# Patient Record
Sex: Female | Born: 1965 | Race: White | Hispanic: No | Smoking: Never smoker
Health system: Southern US, Community
[De-identification: ages and names within clinical notes are randomized; demographics above are authoritative.]

## PROBLEM LIST (undated history)

## (undated) DIAGNOSIS — I10 Essential (primary) hypertension: Secondary | ICD-10-CM

## (undated) DIAGNOSIS — O1490 Unspecified pre-eclampsia, unspecified trimester: Secondary | ICD-10-CM

## (undated) DIAGNOSIS — M1611 Unilateral primary osteoarthritis, right hip: Secondary | ICD-10-CM

## (undated) DIAGNOSIS — C801 Malignant (primary) neoplasm, unspecified: Secondary | ICD-10-CM

## (undated) HISTORY — DX: Unspecified pre-eclampsia, unspecified trimester: O14.90

## (undated) HISTORY — DX: Malignant (primary) neoplasm, unspecified: C80.1

## (undated) HISTORY — PX: FACIAL COSMETIC SURGERY: SHX629

## (undated) HISTORY — DX: Essential (primary) hypertension: I10

---

## 1998-05-02 ENCOUNTER — Other Ambulatory Visit: Admission: RE | Admit: 1998-05-02 | Discharge: 1998-05-02 | Payer: Self-pay | Admitting: Gynecology

## 1998-08-07 ENCOUNTER — Encounter: Admission: RE | Admit: 1998-08-07 | Discharge: 1998-08-07 | Payer: Self-pay | Admitting: Sports Medicine

## 1998-11-21 ENCOUNTER — Encounter (INDEPENDENT_AMBULATORY_CARE_PROVIDER_SITE_OTHER): Payer: Self-pay

## 1998-11-21 ENCOUNTER — Inpatient Hospital Stay (HOSPITAL_COMMUNITY): Admission: AD | Admit: 1998-11-21 | Discharge: 1998-11-23 | Payer: Self-pay | Admitting: Gynecology

## 1999-01-07 ENCOUNTER — Other Ambulatory Visit: Admission: RE | Admit: 1999-01-07 | Discharge: 1999-01-07 | Payer: Self-pay | Admitting: Gynecology

## 1999-01-22 ENCOUNTER — Encounter: Admission: RE | Admit: 1999-01-22 | Discharge: 1999-01-22 | Payer: Self-pay | Admitting: Family Medicine

## 1999-01-22 ENCOUNTER — Encounter: Payer: Self-pay | Admitting: Family Medicine

## 1999-01-22 ENCOUNTER — Encounter: Admission: RE | Admit: 1999-01-22 | Discharge: 1999-01-22 | Payer: Self-pay | Admitting: *Deleted

## 1999-01-30 ENCOUNTER — Encounter: Admission: RE | Admit: 1999-01-30 | Discharge: 1999-01-30 | Payer: Self-pay | Admitting: Sports Medicine

## 1999-03-20 ENCOUNTER — Encounter: Admission: RE | Admit: 1999-03-20 | Discharge: 1999-03-20 | Payer: Self-pay | Admitting: Sports Medicine

## 1999-07-14 ENCOUNTER — Encounter: Admission: RE | Admit: 1999-07-14 | Discharge: 1999-07-14 | Payer: Self-pay | Admitting: Family Medicine

## 1999-08-22 ENCOUNTER — Ambulatory Visit: Admission: RE | Admit: 1999-08-22 | Discharge: 1999-08-22 | Payer: Self-pay | Admitting: Family Medicine

## 1999-08-22 ENCOUNTER — Encounter: Admission: RE | Admit: 1999-08-22 | Discharge: 1999-08-22 | Payer: Self-pay | Admitting: Family Medicine

## 1999-09-11 ENCOUNTER — Encounter: Admission: RE | Admit: 1999-09-11 | Discharge: 1999-09-11 | Payer: Self-pay | Admitting: Sports Medicine

## 1999-11-03 ENCOUNTER — Encounter: Admission: RE | Admit: 1999-11-03 | Discharge: 1999-11-03 | Payer: Self-pay | Admitting: Family Medicine

## 1999-11-19 ENCOUNTER — Encounter: Admission: RE | Admit: 1999-11-19 | Discharge: 1999-11-19 | Payer: Self-pay | Admitting: Family Medicine

## 2000-03-02 ENCOUNTER — Other Ambulatory Visit: Admission: RE | Admit: 2000-03-02 | Discharge: 2000-03-02 | Payer: Self-pay | Admitting: Gynecology

## 2000-03-31 ENCOUNTER — Encounter: Admission: RE | Admit: 2000-03-31 | Discharge: 2000-03-31 | Payer: Self-pay | Admitting: Family Medicine

## 2000-04-08 ENCOUNTER — Encounter: Admission: RE | Admit: 2000-04-08 | Discharge: 2000-04-08 | Payer: Self-pay | Admitting: Sports Medicine

## 2000-07-19 ENCOUNTER — Encounter: Admission: RE | Admit: 2000-07-19 | Discharge: 2000-07-19 | Payer: Self-pay | Admitting: Family Medicine

## 2001-03-11 ENCOUNTER — Other Ambulatory Visit: Admission: RE | Admit: 2001-03-11 | Discharge: 2001-03-11 | Payer: Self-pay | Admitting: Gynecology

## 2001-09-29 ENCOUNTER — Encounter: Admission: RE | Admit: 2001-09-29 | Discharge: 2001-09-29 | Payer: Self-pay | Admitting: Sports Medicine

## 2002-02-24 ENCOUNTER — Encounter: Admission: RE | Admit: 2002-02-24 | Discharge: 2002-02-24 | Payer: Self-pay | Admitting: Family Medicine

## 2002-03-17 ENCOUNTER — Other Ambulatory Visit: Admission: RE | Admit: 2002-03-17 | Discharge: 2002-03-17 | Payer: Self-pay | Admitting: Gynecology

## 2002-05-12 ENCOUNTER — Encounter: Admission: RE | Admit: 2002-05-12 | Discharge: 2002-05-12 | Payer: Self-pay | Admitting: Family Medicine

## 2002-06-06 ENCOUNTER — Encounter: Admission: RE | Admit: 2002-06-06 | Discharge: 2002-06-06 | Payer: Self-pay | Admitting: Family Medicine

## 2002-06-08 ENCOUNTER — Encounter: Admission: RE | Admit: 2002-06-08 | Discharge: 2002-06-08 | Payer: Self-pay | Admitting: Family Medicine

## 2004-02-23 ENCOUNTER — Emergency Department (HOSPITAL_COMMUNITY): Admission: EM | Admit: 2004-02-23 | Discharge: 2004-02-23 | Payer: Self-pay | Admitting: Family Medicine

## 2005-05-19 ENCOUNTER — Emergency Department (HOSPITAL_COMMUNITY): Admission: EM | Admit: 2005-05-19 | Discharge: 2005-05-19 | Payer: Self-pay | Admitting: Family Medicine

## 2006-04-08 ENCOUNTER — Ambulatory Visit: Payer: Self-pay | Admitting: Sports Medicine

## 2006-06-02 ENCOUNTER — Telehealth: Payer: Self-pay | Admitting: *Deleted

## 2006-06-02 ENCOUNTER — Ambulatory Visit: Payer: Self-pay | Admitting: Sports Medicine

## 2006-06-02 ENCOUNTER — Ambulatory Visit (HOSPITAL_COMMUNITY): Admission: RE | Admit: 2006-06-02 | Discharge: 2006-06-02 | Payer: Self-pay | Admitting: Family Medicine

## 2006-06-02 DIAGNOSIS — R072 Precordial pain: Secondary | ICD-10-CM | POA: Insufficient documentation

## 2007-04-07 ENCOUNTER — Ambulatory Visit: Payer: Self-pay | Admitting: Sports Medicine

## 2007-04-07 DIAGNOSIS — L258 Unspecified contact dermatitis due to other agents: Secondary | ICD-10-CM | POA: Insufficient documentation

## 2007-04-07 DIAGNOSIS — G43909 Migraine, unspecified, not intractable, without status migrainosus: Secondary | ICD-10-CM | POA: Insufficient documentation

## 2007-04-07 DIAGNOSIS — R030 Elevated blood-pressure reading, without diagnosis of hypertension: Secondary | ICD-10-CM | POA: Insufficient documentation

## 2007-04-07 DIAGNOSIS — R7309 Other abnormal glucose: Secondary | ICD-10-CM | POA: Insufficient documentation

## 2007-04-21 ENCOUNTER — Ambulatory Visit: Payer: Self-pay | Admitting: Sports Medicine

## 2007-04-21 DIAGNOSIS — D485 Neoplasm of uncertain behavior of skin: Secondary | ICD-10-CM | POA: Insufficient documentation

## 2007-06-09 ENCOUNTER — Ambulatory Visit: Payer: Self-pay | Admitting: Sports Medicine

## 2007-06-09 DIAGNOSIS — C4490 Unspecified malignant neoplasm of skin, unspecified: Secondary | ICD-10-CM | POA: Insufficient documentation

## 2007-11-08 ENCOUNTER — Ambulatory Visit: Payer: Self-pay | Admitting: Sports Medicine

## 2007-11-08 DIAGNOSIS — M79609 Pain in unspecified limb: Secondary | ICD-10-CM | POA: Insufficient documentation

## 2007-11-08 LAB — CONVERTED CEMR LAB
RBC: 4.01 M/uL (ref 3.87–5.11)
Rhuematoid fact SerPl-aCnc: <20 intl units/mL (ref 0–20)
WBC: 6.1 10*3/uL (ref 4.0–10.5)

## 2007-11-10 ENCOUNTER — Telehealth: Payer: Self-pay | Admitting: Sports Medicine

## 2008-04-04 ENCOUNTER — Ambulatory Visit: Payer: Self-pay | Admitting: Sports Medicine

## 2008-04-04 ENCOUNTER — Encounter (INDEPENDENT_AMBULATORY_CARE_PROVIDER_SITE_OTHER): Payer: Self-pay | Admitting: Family Medicine

## 2008-04-04 DIAGNOSIS — M25569 Pain in unspecified knee: Secondary | ICD-10-CM | POA: Insufficient documentation

## 2008-04-04 DIAGNOSIS — M25519 Pain in unspecified shoulder: Secondary | ICD-10-CM | POA: Insufficient documentation

## 2008-04-09 ENCOUNTER — Encounter (INDEPENDENT_AMBULATORY_CARE_PROVIDER_SITE_OTHER): Payer: Self-pay | Admitting: *Deleted

## 2008-04-10 ENCOUNTER — Encounter: Admission: RE | Admit: 2008-04-10 | Discharge: 2008-04-10 | Payer: Self-pay | Admitting: Sports Medicine

## 2008-04-18 ENCOUNTER — Encounter (INDEPENDENT_AMBULATORY_CARE_PROVIDER_SITE_OTHER): Payer: Self-pay | Admitting: *Deleted

## 2008-04-18 ENCOUNTER — Ambulatory Visit: Payer: Self-pay | Admitting: Family Medicine

## 2008-04-19 ENCOUNTER — Encounter: Payer: Self-pay | Admitting: Sports Medicine

## 2008-04-20 ENCOUNTER — Encounter: Payer: Self-pay | Admitting: Sports Medicine

## 2008-04-26 ENCOUNTER — Encounter: Admission: RE | Admit: 2008-04-26 | Discharge: 2008-04-26 | Payer: Self-pay | Admitting: Family Medicine

## 2008-10-31 ENCOUNTER — Ambulatory Visit: Payer: Self-pay | Admitting: Sports Medicine

## 2008-10-31 DIAGNOSIS — T887XXA Unspecified adverse effect of drug or medicament, initial encounter: Secondary | ICD-10-CM | POA: Insufficient documentation

## 2009-01-28 ENCOUNTER — Encounter: Admission: RE | Admit: 2009-01-28 | Discharge: 2009-01-28 | Payer: Self-pay | Admitting: Sports Medicine

## 2009-01-28 ENCOUNTER — Ambulatory Visit: Payer: Self-pay | Admitting: Sports Medicine

## 2009-01-28 DIAGNOSIS — M542 Cervicalgia: Secondary | ICD-10-CM | POA: Insufficient documentation

## 2009-07-04 ENCOUNTER — Other Ambulatory Visit: Admission: RE | Admit: 2009-07-04 | Discharge: 2009-07-04 | Payer: Self-pay | Admitting: Gynecology

## 2009-07-04 ENCOUNTER — Ambulatory Visit: Payer: Self-pay | Admitting: Gynecology

## 2010-01-23 IMAGING — CR DG CERVICAL SPINE COMPLETE 4+V
5 series · 5 of 5 positions shown · non-contrast
Comparison: Cervical spine series 02/23/2004.

CLINICAL DATA: Right sided neck pain.  No recent injuries.

CERVICAL SPINE - COMPLETE 4+ VIEW 01/28/2009:

[view not recorded (1 of 5)]
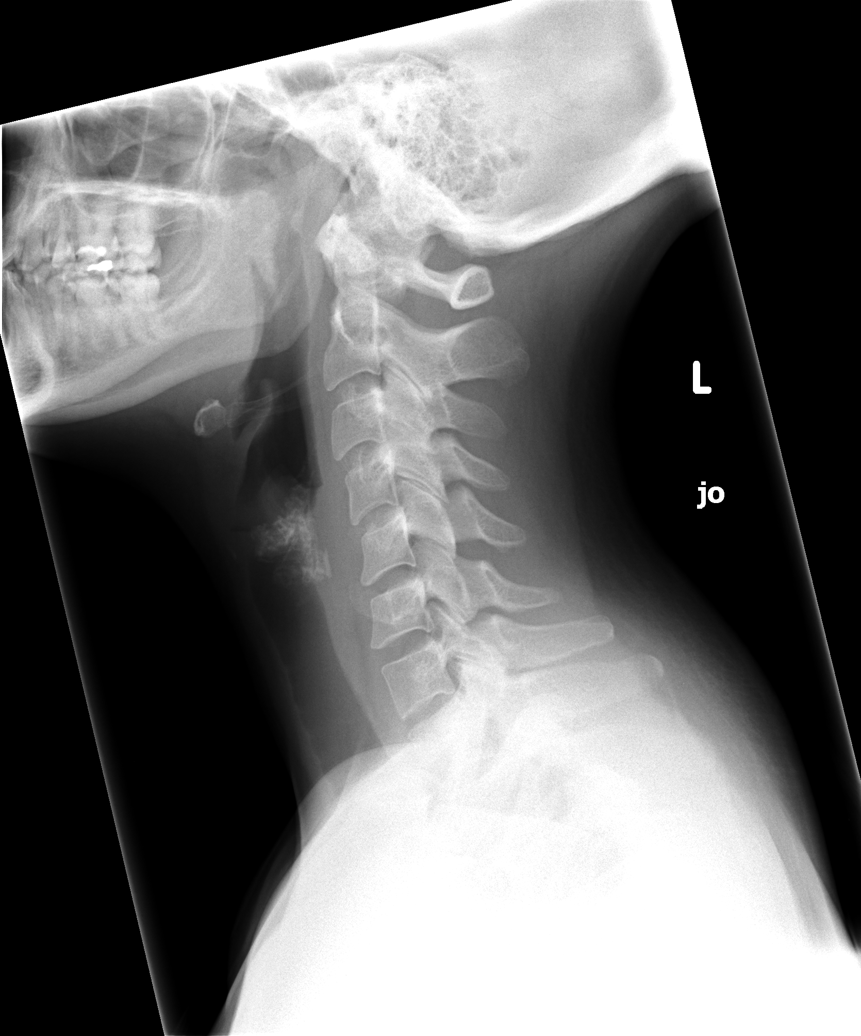

[view not recorded (2 of 5)]
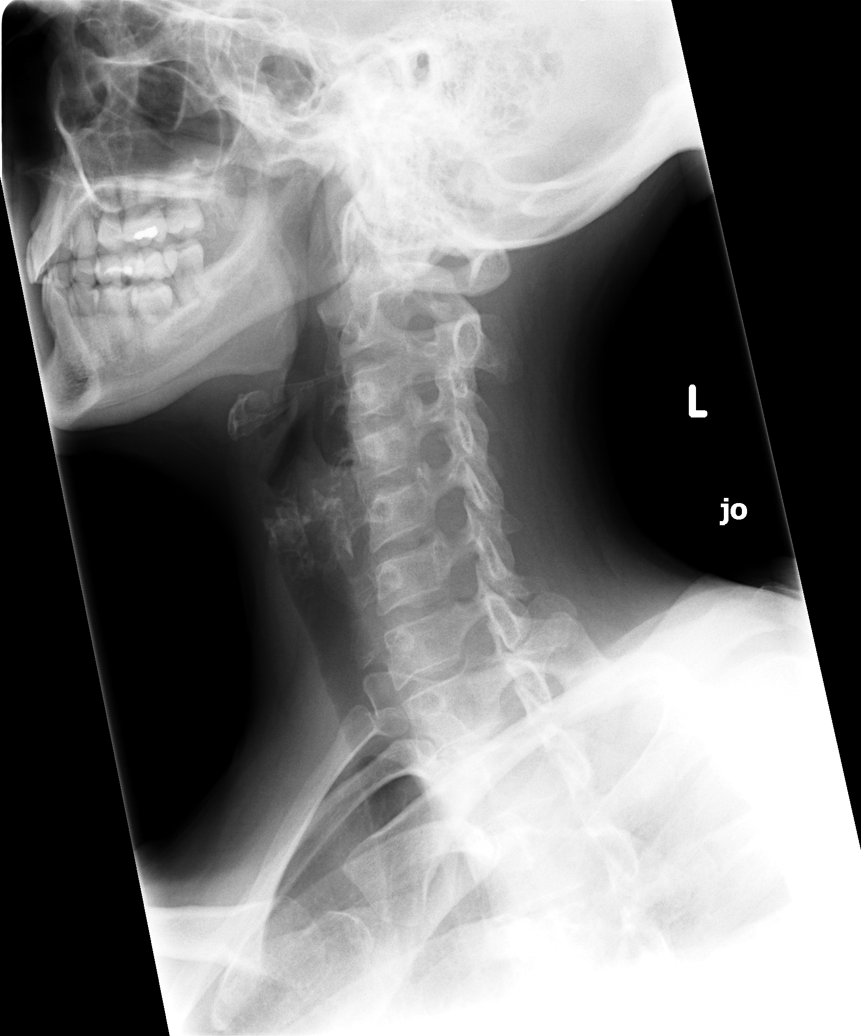

[view not recorded (3 of 5)]
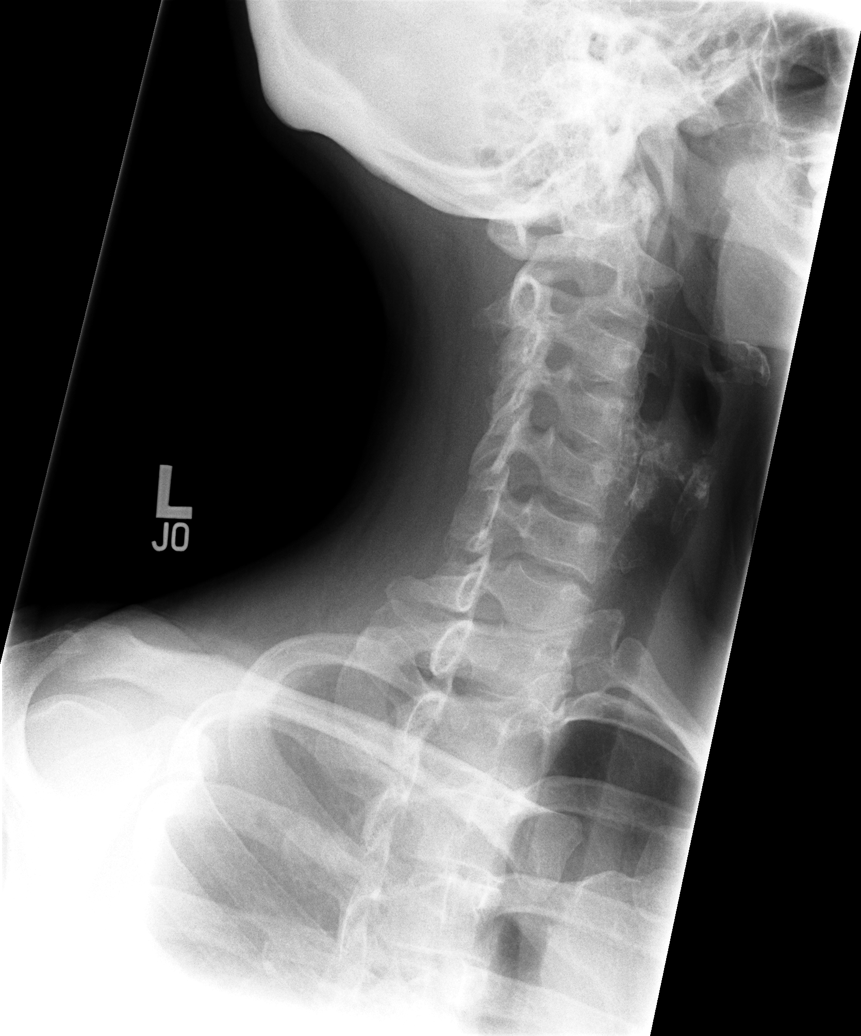

[view not recorded (4 of 5)]
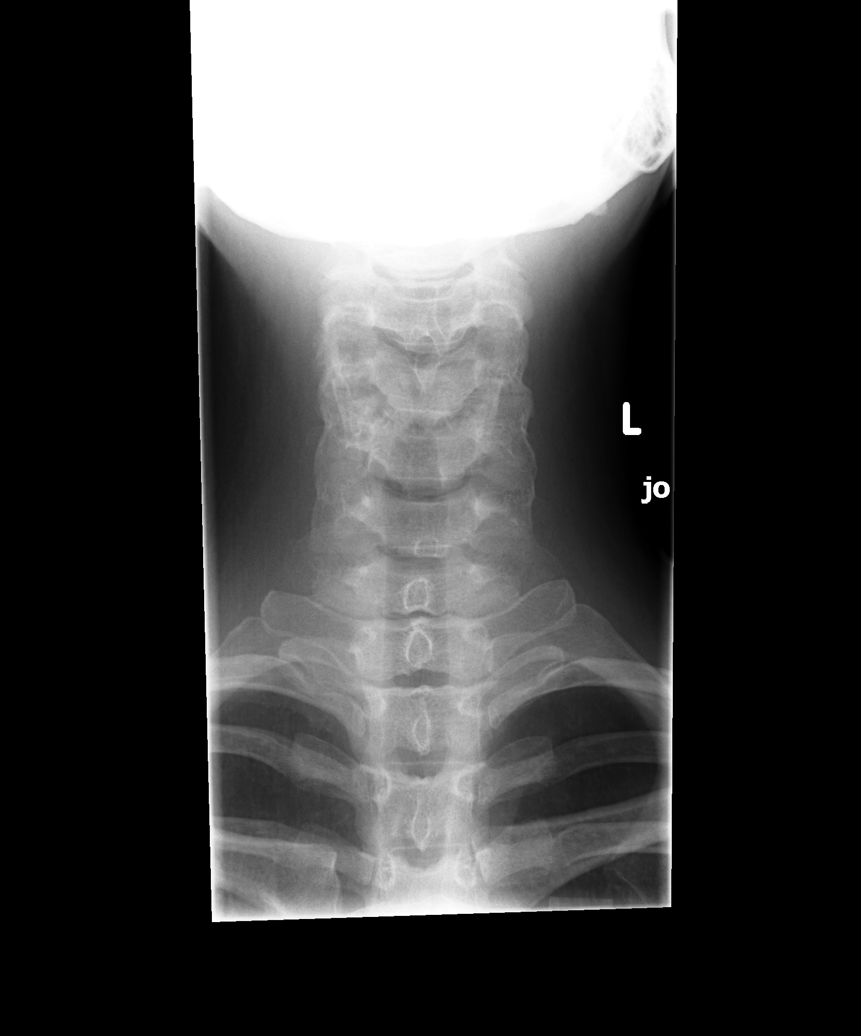

[view not recorded (5 of 5)]
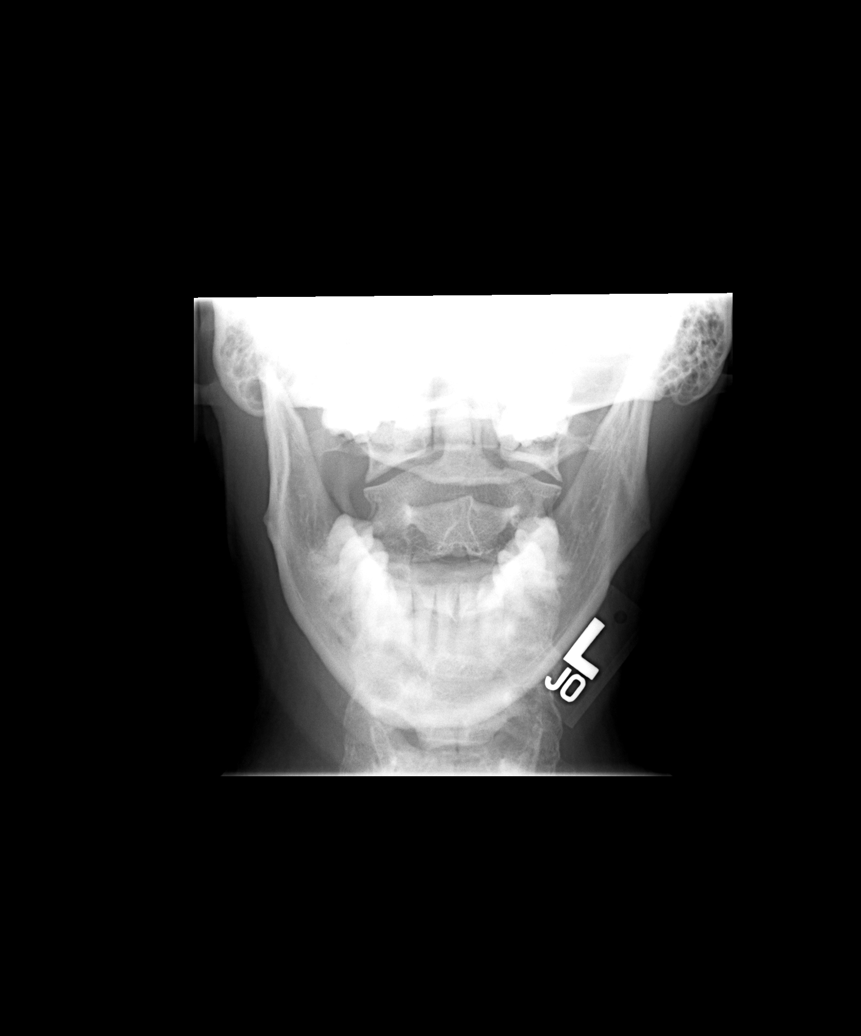

[5 of 5 positions shown; findings below may reference images not displayed]

FINDINGS: Anatomic posterior alignment.  No fractures.  Well-
preserved disc spaces, unchanged.  Normal prevertebral soft
tissues.  Facet joints intact.  No significant bony foraminal
stenoses.  No static evidence of instability.  No significant
interval change.
IMPRESSION: Normal examination, stable since February 2004.

## 2010-02-02 ENCOUNTER — Emergency Department (HOSPITAL_COMMUNITY)
Admission: EM | Admit: 2010-02-02 | Discharge: 2010-02-02 | Payer: Self-pay | Source: Home / Self Care | Admitting: Emergency Medicine

## 2010-03-09 LAB — CONVERTED CEMR LAB
Glucose, Fasting: 90 mg/dL (ref 70–99)
LDL Cholesterol: 90 mg/dL (ref 0–99)

## 2010-04-22 ENCOUNTER — Encounter: Payer: Self-pay | Admitting: *Deleted

## 2010-05-23 ENCOUNTER — Other Ambulatory Visit: Payer: Self-pay | Admitting: Dermatology

## 2010-07-07 ENCOUNTER — Other Ambulatory Visit: Payer: Self-pay | Admitting: Sports Medicine

## 2010-07-14 ENCOUNTER — Other Ambulatory Visit: Payer: Self-pay | Admitting: *Deleted

## 2010-07-14 MED ORDER — PROPRANOLOL HCL 20 MG PO TABS: 20.0000 mg | ORAL_TABLET | Freq: Two times a day (BID) | ORAL | Status: DC

## 2010-07-14 NOTE — Progress Notes (Signed)
Pt states she called her pharmacy for refill on propranolol, and they said the office denied refill.  Advised I was not aware that we had been contacted for this refill request.  Advised pt that Dr. Darrick Penna is focusing on sports med, and no longer following patients for primary care.  Advised pt will sent 2 months refills on propranolol, but she should schedule appt to establish care with a new PCP to start rxing her chronic meds.  Pt agreeable, gave a suggestion of Dr. Shanda Bumps Copland - gave pt contact info for her practice.

## 2010-08-07 ENCOUNTER — Encounter: Payer: Self-pay | Admitting: Gynecology

## 2010-09-02 ENCOUNTER — Ambulatory Visit (INDEPENDENT_AMBULATORY_CARE_PROVIDER_SITE_OTHER): Payer: BC Managed Care – PPO | Admitting: Gynecology

## 2010-09-02 ENCOUNTER — Other Ambulatory Visit (HOSPITAL_COMMUNITY)
Admission: RE | Admit: 2010-09-02 | Discharge: 2010-09-02 | Disposition: A | Discharge status: Home / Self Care | Payer: BC Managed Care – PPO | Source: Ambulatory Visit | Attending: Gynecology | Admitting: Gynecology

## 2010-09-02 ENCOUNTER — Encounter: Payer: Self-pay | Admitting: Gynecology

## 2010-09-02 VITALS — BP 128/88 | Ht 70.75 in | Wt 167.0 lb

## 2010-09-02 DIAGNOSIS — Z124 Encounter for screening for malignant neoplasm of cervix: Secondary | ICD-10-CM

## 2010-09-02 DIAGNOSIS — M79609 Pain in unspecified limb: Secondary | ICD-10-CM

## 2010-09-02 DIAGNOSIS — L258 Unspecified contact dermatitis due to other agents: Secondary | ICD-10-CM

## 2010-09-02 DIAGNOSIS — Z13220 Encounter for screening for lipoid disorders: Secondary | ICD-10-CM

## 2010-09-02 DIAGNOSIS — Z01419 Encounter for gynecological examination (general) (routine) without abnormal findings: Secondary | ICD-10-CM | POA: Insufficient documentation

## 2010-09-02 DIAGNOSIS — M25519 Pain in unspecified shoulder: Secondary | ICD-10-CM

## 2010-09-02 DIAGNOSIS — T887XXA Unspecified adverse effect of drug or medicament, initial encounter: Secondary | ICD-10-CM

## 2010-09-02 DIAGNOSIS — I10 Essential (primary) hypertension: Secondary | ICD-10-CM

## 2010-09-02 DIAGNOSIS — R030 Elevated blood-pressure reading, without diagnosis of hypertension: Secondary | ICD-10-CM

## 2010-09-02 DIAGNOSIS — G43909 Migraine, unspecified, not intractable, without status migrainosus: Secondary | ICD-10-CM

## 2010-09-02 DIAGNOSIS — M25569 Pain in unspecified knee: Secondary | ICD-10-CM

## 2010-09-02 LAB — COMPREHENSIVE METABOLIC PANEL
ALT: 19 U/L (ref 0–35)
AST: 19 U/L (ref 0–37)
Albumin: 4.7 g/dL (ref 3.5–5.2)
Alkaline Phosphatase: 44 U/L (ref 39–117)
BUN: 15 mg/dL (ref 6–23)
CO2: 28 mEq/L (ref 19–32)
Calcium: 9.9 mg/dL (ref 8.4–10.5)
Chloride: 103 mEq/L (ref 96–112)
Creat: 0.86 mg/dL (ref 0.50–1.10)
Glucose, Bld: 102 mg/dL — ABNORMAL HIGH (ref 70–99)
Potassium: 4.3 mEq/L (ref 3.5–5.3)
Sodium: 141 mEq/L (ref 135–145)
Total Bilirubin: 0.6 mg/dL (ref 0.3–1.2)
Total Protein: 7.4 g/dL (ref 6.0–8.3)

## 2010-09-02 NOTE — Progress Notes (Signed)
Subjective:     Tasha Anderson is a 45 y.o. female here for a routine exam.  Current complaints: none.  Personal health questionnaire reviewed: yes.   Gynecologic History Patient's last menstrual period was 08/12/2010. Contraception: vasectomy Last Pap: 2011 Results were: normal Last mammogram: 2011. Results were: normal  Obstetric History OB History    Grav Para Term Preterm Abortions TAB SAB Ect Mult Living   2 2        2      # Outc Date GA Lbr Len/2nd Wgt Sex Del Anes PTL Lv   1 PAR            2 PAR                The following portions of the patient's history were reviewed and updated as appropriate: allergies, current medications, past family history, past medical history, past social history, past surgical history and problem list.  Review of Systems A comprehensive review of systems was negative.    Objective:    General appearance: alert   well-developed well-nourished female in no acute distress. HEENT unremarkable Lungs: Clear to auscultation Rogers or wheezes Heart: Regular rate and rhythm no murmurs or gallops Breast exam: Symmetrical no palpable masses or tenderness no supraclavicular or axillary lymphadenopathy Abdomen: Soft nontender no rebound or guarding Pelvic: Bartholin urethra Skene was within normal limits, vagina no lesions or discharge, cervix no motion tenderness, uterus anteverted normal size shape and consistency, adnexa no palpable mass or tenderness, rectal exam deferred.  Assessment:    Healthy female exam.    Plan:    Education reviewed: calcium supplements and self breast exams. Mammogram ordered. Follow up in: 1 years.  the following labs will be drawn today. CBC, comprehensive metabolic panel, TSH, blood sugar, urinalysis, and Pap smear.

## 2010-09-02 NOTE — Patient Instructions (Signed)
Please remember to schedule your mammogram

## 2010-12-15 ENCOUNTER — Ambulatory Visit (INDEPENDENT_AMBULATORY_CARE_PROVIDER_SITE_OTHER): Payer: BC Managed Care – PPO | Admitting: Sports Medicine

## 2010-12-15 VITALS — BP 116/70 | Ht 72.0 in | Wt 159.0 lb

## 2010-12-15 DIAGNOSIS — M79651 Pain in right thigh: Secondary | ICD-10-CM

## 2010-12-15 DIAGNOSIS — M25559 Pain in unspecified hip: Secondary | ICD-10-CM

## 2010-12-15 DIAGNOSIS — M25551 Pain in right hip: Secondary | ICD-10-CM

## 2010-12-15 DIAGNOSIS — M79673 Pain in unspecified foot: Secondary | ICD-10-CM | POA: Insufficient documentation

## 2010-12-15 DIAGNOSIS — M722 Plantar fascial fibromatosis: Secondary | ICD-10-CM

## 2010-12-15 DIAGNOSIS — M79609 Pain in unspecified limb: Secondary | ICD-10-CM

## 2010-12-15 MED ORDER — NITROGLYCERIN 0.2 MG/HR TD PT24: MEDICATED_PATCH | TRANSDERMAL | Status: DC

## 2010-12-15 NOTE — Assessment & Plan Note (Signed)
Try using a heel cup at first and begin plantar fascial stretches and exercises

## 2010-12-15 NOTE — Progress Notes (Signed)
  Subjective:    Patient ID: Tasha Anderson, female    DOB: 1965/05/02, 45 y.o.   MRN: [id number]  HPI  Pt presents to clinic for evaluation of rt anterior hip that started at the end of September. Prior to onset of pain, she went hiking and came down a steep incline where she slipped on an acorn, but did not fall.  Pain did not start immediately after the hike, but woke up a few days later with the hip pain.  Also reports pain posterior hip below buttocks, and medially with pain is severe.  Hip Pain is worsening now, and is constant. Has significant pain at night.  Takes ibuprofen occasionally, which is slightly helpful. 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  Stopped running due to pain, but has pain just with walking also.  Has right heel pain that started about 1 month ago as well. Pain worse in the mornings, has to put shoes on before getting out of bed.  Has had plantar fasciitis before and pain was similar.    Review of Systems     Objective:   Physical Exam  50 deg IR and 45 ER bilateral hips seated position Slight discomfort with hip flexion on rt, but strong Sartorius testing strong, not painful Good SI joint motion bilat, lt slightly higher than rt FABER painful on rt, not on lt  No pain with rt hip abduction ASIS non tender to palpation  ISIS painful to palpation   Musculoskeletal ultrasound The right thigh is visualized The muscles and attachments to the ASIS look normal At the level of the ISIS there is hypoechoic change at the insertion of the rectus femoris This is visualized well on both longitudinal and transverse scans There is increased upper activity There is no sign of retraction or acute muscle rupture       Assessment & Plan:

## 2010-12-15 NOTE — Assessment & Plan Note (Signed)
I gave her a series of hip flexion rotation and extension exercises Use compression sleeve for the thigh  Trial of nitroglycerin patches  Recheck in one mo

## 2011-01-14 ENCOUNTER — Ambulatory Visit: Payer: BC Managed Care – PPO | Admitting: Sports Medicine

## 2011-03-20 ENCOUNTER — Encounter: Payer: Self-pay | Admitting: Gynecology

## 2011-09-04 ENCOUNTER — Encounter: Payer: BC Managed Care – PPO | Admitting: Gynecology

## 2011-09-25 ENCOUNTER — Encounter: Payer: BC Managed Care – PPO | Admitting: Gynecology

## 2011-10-02 ENCOUNTER — Encounter: Payer: Self-pay | Admitting: Gynecology

## 2011-10-02 ENCOUNTER — Ambulatory Visit (INDEPENDENT_AMBULATORY_CARE_PROVIDER_SITE_OTHER): Payer: BC Managed Care – PPO | Admitting: Gynecology

## 2011-10-02 VITALS — BP 124/84 | Ht 71.75 in | Wt 164.0 lb

## 2011-10-02 DIAGNOSIS — Z01419 Encounter for gynecological examination (general) (routine) without abnormal findings: Secondary | ICD-10-CM

## 2011-10-02 NOTE — Patient Instructions (Addendum)

## 2011-10-02 NOTE — Progress Notes (Signed)
Tasha Anderson 1965/07/24 [id number]   History:    46 y.o.  for annual gyn exam with no complaints today. Review of patient's records indicated that she has a past history of basal cell carcinoma of the right leg and left shoulder and has been followed every 6 months by [facility]. Her husband was recently diagnosed with colon cancer. Dr. Cyndia Bent her primary physician will be doing her labs in the next few days. She frequently does her self breast examination. Her mammogram was normal February this year. She is taking her calcium and vitamin D twice a day. She reports her cycles of been regular. Her husband has had a vasectomy.  Past medical history,surgical history, family history and social history were all reviewed and documented in the EPIC chart.  Gynecologic History Patient's last menstrual period was 09/21/2011. Contraception: vasectomy Last Pap: 2012. Results were: normal Last mammogram: 2013. Results were: normal  Obstetric History OB History    Grav Para Term Preterm Abortions TAB SAB Ect Mult Living   2 2        2      # Outc Date GA Lbr Len/2nd Wgt Sex Del Anes PTL Lv   1 PAR            2 PAR                ROS: A ROS was performed and pertinent positives and negatives are included in the history.  GENERAL: No fevers or chills. HEENT: No change in vision, no earache, sore throat or sinus congestion. NECK: No pain or stiffness. CARDIOVASCULAR: No chest pain or pressure. No palpitations. PULMONARY: No shortness of breath, cough or wheeze. GASTROINTESTINAL: No abdominal pain, nausea, vomiting or diarrhea, melena or bright red blood per rectum. GENITOURINARY: No urinary frequency, urgency, hesitancy or dysuria. MUSCULOSKELETAL: No joint or muscle pain, no back pain, no recent trauma. DERMATOLOGIC: No rash, no itching, no lesions. ENDOCRINE: No polyuria, polydipsia, no heat or cold intolerance. No recent change in weight. HEMATOLOGICAL: No anemia or easy bruising or  bleeding. NEUROLOGIC: No headache, seizures, numbness, tingling or weakness. PSYCHIATRIC: No depression, no loss of interest in normal activity or change in sleep pattern.     Exam: chaperone present  BP 124/84  Ht 5' 11.75" (1.822 m)  Wt 164 lb (74.39 kg)  BMI 22.40 kg/m2  LMP 09/21/2011  Body mass index is 22.40 kg/(m^2).  General appearance : Well developed well nourished female. No acute distress HEENT: Neck supple, trachea midline, no carotid bruits, no thyroidmegaly Lungs: Clear to auscultation, no rhonchi or wheezes, or rib retractions  Heart: Regular rate and rhythm, no murmurs or gallops Breast:Examined in sitting and supine position were symmetrical in appearance, no palpable masses or tenderness,  no skin retraction, no nipple inversion, no nipple discharge, no skin discoloration, no axillary or supraclavicular lymphadenopathy Abdomen: no palpable masses or tenderness, no rebound or guarding Extremities: no edema or skin discoloration or tenderness  Pelvic:  Bartholin, Urethra, Skene Glands: Within normal limits             Vagina: No gross lesions or discharge  Cervix: No gross lesions or discharge  Uterus  anteverted, normal size, shape and consistency, non-tender and mobile  Adnexa  Without masses or tenderness  Anus and perineum  normal   Rectovaginal  normal sphincter tone without palpated masses or tenderness             Hemoccult not done     Assessment/Plan:  46 y.o. female for annual exam who will be obtaining her lab work with her primary physician within the next few weeks. We did discuss today the new Pap smear screening guidelines and she will not need a Pap smear today. She was instructed to continue to do her monthly self breast examination. We discussed importance of calcium and vitamin D for osteoporosis prevention. We discussed importance of regular exercise as well. She will check with her primary physician to see that her dTap Vaccine is  up-to-date. Ok Edwards MD, 10:31 AM 10/02/2011

## 2012-05-24 ENCOUNTER — Encounter: Payer: Self-pay | Admitting: Gynecology

## 2012-09-20 ENCOUNTER — Encounter: Payer: Self-pay | Admitting: Internal Medicine

## 2012-09-20 NOTE — Progress Notes (Signed)
Faxed fmla form to [employer] @ [phone].

## 2012-12-05 ENCOUNTER — Encounter: Payer: Self-pay | Admitting: Gynecology

## 2012-12-23 ENCOUNTER — Encounter: Payer: Self-pay | Admitting: Gynecology

## 2012-12-23 ENCOUNTER — Ambulatory Visit (INDEPENDENT_AMBULATORY_CARE_PROVIDER_SITE_OTHER): Payer: 59 | Admitting: Gynecology

## 2012-12-23 VITALS — BP 114/70 | Ht 71.25 in | Wt 165.0 lb

## 2012-12-23 DIAGNOSIS — Z01419 Encounter for gynecological examination (general) (routine) without abnormal findings: Secondary | ICD-10-CM

## 2012-12-23 NOTE — Progress Notes (Signed)
Tasha Anderson [birth year] [id number]   History:    47 y.o.  for annual gyn exam With no complaints today.Review of patient's records indicated that she has a past history of basal cell carcinoma of the right leg and left shoulder and has been followed every 6 months by [facility] dermat[facility]. Her husband was recently diagnosed with colon cancer. Dr. Cyndia Bent her primary physician will be doing her labs in the next few days. She frequently does her self breast examination. Her mammogram was normal this year although dense and had a 3-D mammogram.She is taking her calcium and vitamin D twice a day. She reports her cycles of been regular. Her husband has had a vasectomy.    Past medical history,surgical history, family history and social history were all reviewed and documented in the EPIC chart.  Gynecologic History Patient's last menstrual period was 12/10/2012. Contraception: vasectomy Last Pap: 2012. Results were: normal Last mammogram: 2014. Results were: normal but dense  Obstetric History OB History  Gravida Para Term Preterm AB SAB TAB Ectopic Multiple Living  2 2        2     # Outcome Date GA Lbr Len/2nd Weight Sex Delivery Anes PTL Lv  2 PAR           1 PAR                ROS: A ROS was performed and pertinent positives and negatives are included in the history.  GENERAL: No fevers or chills. HEENT: No change in vision, no earache, sore throat or sinus congestion. NECK: No pain or stiffness. CARDIOVASCULAR: No chest pain or pressure. No palpitations. PULMONARY: No shortness of breath, cough or wheeze. GASTROINTESTINAL: No abdominal pain, nausea, vomiting or diarrhea, melena or bright red blood per rectum. GENITOURINARY: No urinary frequency, urgency, hesitancy or dysuria. MUSCULOSKELETAL: No joint or muscle pain, no back pain, no recent trauma. DERMATOLOGIC: No rash, no itching, no lesions. ENDOCRINE: No polyuria, polydipsia, no heat or cold intolerance. No recent change in weight.  HEMATOLOGICAL: No anemia or easy bruising or bleeding. NEUROLOGIC: No headache, seizures, numbness, tingling or weakness. PSYCHIATRIC: No depression, no loss of interest in normal activity or change in sleep pattern.     Exam: chaperone present  BP 114/70  Ht 5' 11.25" (1.81 m)  Wt 165 lb (74.844 kg)  BMI 22.85 kg/m2  LMP 12/10/2012  Body mass index is 22.85 kg/(m^2).  General appearance : Well developed well nourished female. No acute distress HEENT: Neck supple, trachea midline, no carotid bruits, no thyroidmegaly Lungs: Clear to auscultation, no rhonchi or wheezes, or rib retractions  Heart: Regular rate and rhythm, no murmurs or gallops Breast:Examined in sitting and supine position were symmetrical in appearance, no palpable masses or tenderness,  no skin retraction, no nipple inversion, no nipple discharge, no skin discoloration, no axillary or supraclavicular lymphadenopathy Abdomen: no palpable masses or tenderness, no rebound or guarding Extremities: no edema or skin discoloration or tenderness  Pelvic:  Bartholin, Urethra, Skene Glands: Within normal limits             Vagina: No gross lesions or discharge  Cervix: No gross lesions or discharge  Uterus  anteverted, normal size, shape and consistency, non-tender and mobile  Adnexa  Without masses or tenderness  Anus and perineum  normal   Rectovaginal  normal sphincter tone without palpated masses or tenderness             Hemoccult plan indicated  Assessment/Plan:  47 y.o. female for annual exam with no abnormalities detected. Her PCP will be drawn her blood work. She will ask that he submit a copy of her labs do so that we can scan and put in our electronic records. Pap smear was not done today in accordance to the new guidelines. Patient will have her flu vaccine at work next week. Patient was reminded to do her monthly breast exam.  Note: This dictation was prepared with  Dragon/digital dictation along withSmart  phrase technology. Any transcriptional errors that result from this process are unintentional.   Ok Edwards MD, 12:44 PM 12/23/2012

## 2013-06-26 ENCOUNTER — Encounter: Payer: Self-pay | Admitting: Gynecology

## 2013-09-13 ENCOUNTER — Encounter: Payer: Self-pay | Admitting: Internal Medicine

## 2013-09-13 NOTE — Progress Notes (Signed)
Faxed wife's fmla form to [employer] @ [phone]

## 2013-12-11 ENCOUNTER — Encounter: Payer: Self-pay | Admitting: Gynecology

## 2014-03-19 ENCOUNTER — Other Ambulatory Visit: Payer: Self-pay | Admitting: Dermatology

## 2014-03-20 ENCOUNTER — Other Ambulatory Visit (HOSPITAL_COMMUNITY)
Admission: RE | Admit: 2014-03-20 | Discharge: 2014-03-20 | Disposition: A | Discharge status: Home / Self Care | Payer: BLUE CROSS/BLUE SHIELD | Source: Ambulatory Visit | Attending: Gynecology | Admitting: Gynecology

## 2014-03-20 ENCOUNTER — Ambulatory Visit (INDEPENDENT_AMBULATORY_CARE_PROVIDER_SITE_OTHER): Payer: BLUE CROSS/BLUE SHIELD | Admitting: Gynecology

## 2014-03-20 ENCOUNTER — Encounter: Payer: Self-pay | Admitting: Gynecology

## 2014-03-20 VITALS — BP 130/84 | Ht 71.75 in | Wt 175.0 lb

## 2014-03-20 DIAGNOSIS — N938 Other specified abnormal uterine and vaginal bleeding: Secondary | ICD-10-CM

## 2014-03-20 DIAGNOSIS — N898 Other specified noninflammatory disorders of vagina: Secondary | ICD-10-CM

## 2014-03-20 DIAGNOSIS — Z1151 Encounter for screening for human papillomavirus (HPV): Secondary | ICD-10-CM | POA: Insufficient documentation

## 2014-03-20 DIAGNOSIS — Z01419 Encounter for gynecological examination (general) (routine) without abnormal findings: Secondary | ICD-10-CM

## 2014-03-20 DIAGNOSIS — B373 Candidiasis of vulva and vagina: Secondary | ICD-10-CM

## 2014-03-20 DIAGNOSIS — B3731 Acute candidiasis of vulva and vagina: Secondary | ICD-10-CM

## 2014-03-20 LAB — WET PREP FOR TRICH, YEAST, CLUE
Clue Cells Wet Prep HPF POC: NONE SEEN
TRICH WET PREP: NONE SEEN

## 2014-03-20 MED ORDER — NORETHIN-ETH ESTRAD-FE BIPHAS 1 MG-10 MCG / 10 MCG PO TABS: 1.0000 | ORAL_TABLET | Freq: Every day | ORAL | Status: DC

## 2014-03-20 MED ORDER — FLUCONAZOLE 150 MG PO TABS: 150.0000 mg | ORAL_TABLET | Freq: Once | ORAL | Status: DC

## 2014-03-20 NOTE — Patient Instructions (Addendum)
Tdap Vaccine (Tetanus, Diphtheria, Pertussis): What You Need to Know 1. Why get vaccinated? Tetanus, diphtheria and pertussis can be very serious diseases, even for adolescents and adults. Tdap vaccine can protect us from these diseases. TETANUS (Lockjaw) causes painful muscle tightening and stiffness, usually all over the body.  It can lead to tightening of muscles in the head and neck so you can't open your mouth, swallow, or sometimes even breathe. Tetanus kills about 1 out of 5 people who are infected. DIPHTHERIA can cause a thick coating to form in the back of the throat.  It can lead to breathing problems, paralysis, heart failure, and death. PERTUSSIS (Whooping Cough) causes severe coughing spells, which can cause difficulty breathing, vomiting and disturbed sleep.  It can also lead to weight loss, incontinence, and rib fractures. Up to 2 in 100 adolescents and 5 in 100 adults with pertussis are hospitalized or have complications, which could include pneumonia or death. These diseases are caused by bacteria. Diphtheria and pertussis are spread from person to person through coughing or sneezing. Tetanus enters the body through cuts, scratches, or wounds. Before vaccines, the [place] saw as many as 200,000 cases a year of diphtheria and pertussis, and hundreds of cases of tetanus. Since vaccination began, tetanus and diphtheria have dropped by about 99% and pertussis by about 80%. 2. Tdap vaccine Tdap vaccine can protect adolescents and adults from tetanus, diphtheria, and pertussis. One dose of Tdap is routinely given at age 11 or 12. People who did not get Tdap at that age should get it as soon as possible. Tdap is especially important for health care professionals and anyone having close contact with a baby younger than 12 months. Pregnant women should get a dose of Tdap during every pregnancy, to protect the newborn from pertussis. Infants are most at risk for severe, life-threatening  complications from pertussis. A similar vaccine, called Td, protects from tetanus and diphtheria, but not pertussis. A Td booster should be given every 10 years. Tdap may be given as one of these boosters if you have not already gotten a dose. Tdap may also be given after a severe cut or burn to prevent tetanus infection. Your doctor can give you more information. Tdap may safely be given at the same time as other vaccines. 3. Some people should not get this vaccine  If you ever had a life-threatening allergic reaction after a dose of any tetanus, diphtheria, or pertussis containing vaccine, OR if you have a severe allergy to any part of this vaccine, you should not get Tdap. Tell your doctor if you have any severe allergies.  If you had a coma, or long or multiple seizures within 7 days after a childhood dose of DTP or DTaP, you should not get Tdap, unless a cause other than the vaccine was found. You can still get Td.  Talk to your doctor if you:  have epilepsy or another nervous system problem,  had severe pain or swelling after any vaccine containing diphtheria, tetanus or pertussis,  ever had Guillain-Barr Syndrome (GBS),  aren't feeling well on the day the shot is scheduled. 4. Risks of a vaccine reaction With any medicine, including vaccines, there is a chance of side effects. These are usually mild and go away on their own, but serious reactions are also possible. Brief fainting spells can follow a vaccination, leading to injuries from falling. Sitting or lying down for about 15 minutes can help prevent these. Tell your doctor if you feel   dizzy or light-headed, or have vision changes or ringing in the ears. Mild problems following Tdap (Did not interfere with activities)  Pain where the shot was given (about 3 in 4 adolescents or 2 in 3 adults)  Redness or swelling where the shot was given (about 1 person in 5)  Mild fever of at least 100.51F (up to about 1 in 25 adolescents or  1 in 100 adults)  Headache (about 3 or 4 people in 10)  Tiredness (about 1 person in 3 or 4)  Nausea, vomiting, diarrhea, stomach ache (up to 1 in 4 adolescents or 1 in 10 adults)  Chills, body aches, sore joints, rash, swollen glands (uncommon) Moderate problems following Tdap (Interfered with activities, but did not require medical attention)  Pain where the shot was given (about 1 in 5 adolescents or 1 in 100 adults)  Redness or swelling where the shot was given (up to about 1 in 16 adolescents or 1 in 25 adults)  Fever over 102F (about 1 in 100 adolescents or 1 in 250 adults)  Headache (about 3 in 20 adolescents or 1 in 10 adults)  Nausea, vomiting, diarrhea, stomach ache (up to 1 or 3 people in 100)  Swelling of the entire arm where the shot was given (up to about 3 in 100). Severe problems following Tdap (Unable to perform usual activities; required medical attention)  Swelling, severe pain, bleeding and redness in the arm where the shot was given (rare). A severe allergic reaction could occur after any vaccine (estimated less than 1 in a million doses). 5. What if there is a serious reaction? What should I look for?  Look for anything that concerns you, such as signs of a severe allergic reaction, very high fever, or behavior changes. Signs of a severe allergic reaction can include hives, swelling of the face and throat, difficulty breathing, a fast heartbeat, dizziness, and weakness. These would start a few minutes to a few hours after the vaccination. What should I do?  If you think it is a severe allergic reaction or other emergency that can't wait, call 9-1-1 or get the person to the nearest hospital. Otherwise, call your doctor.  Afterward, the reaction should be reported to the "Vaccine Adverse Event Reporting System" (VAERS). Your doctor might file this report, or you can do it yourself through the VAERS web site at www.vaers.SamedayNews.es, or by calling  (240) 785-7009. VAERS is only for reporting reactions. They do not give medical advice.  6. The National Vaccine Injury Compensation Program The [employer] Vaccine Injury Compensation Program (VICP) is a federal program that was created to compensate people who may have been injured by certain vaccines. Persons who believe they may have been injured by a vaccine can learn about the program and about filing a claim by calling 301-562-8798 or visiting the [place] website at GoldCloset.com.ee. 7. How can I learn more?  Ask your doctor.  Call your local or state health department.  Contact the Centers for Disease Control and Prevention (CDC):  Call (450) 487-1408 or visit CDC's website at http://hunter.com/. CDC Tdap Vaccine VIS (06/18/11) Document Released: 07/28/2011 Document Revised: 06/12/2013 Document Reviewed: 05/10/2013 [employer] Patient Information 2015 [place], [place]. This information is not intended to replace advice given to you by your health care provider. Make sure you discuss any questions you have with your health care provider. Endometrial Biopsy Endometrial biopsy is a procedure in which a tissue sample is taken from inside the uterus. The tissue sample is then looked at  under a microscope to see if the tissue is normal or abnormal. The endometrium is the lining of the uterus. This procedure helps determine where you are in your menstrual cycle and how hormone levels are affecting the lining of the uterus. This procedure may also be used to evaluate uterine bleeding or to diagnose endometrial cancer, tuberculosis, polyps, or inflammatory conditions.  LET [facility] CARE PROVIDER KNOW ABOUT: Any allergies you have. All medicines you are taking, including vitamins, herbs, eye drops, creams, and over-the-counter medicines. Previous problems you or members of your family have had with the use of anesthetics. Any blood disorders you have. Previous surgeries you have  had. Medical conditions you have. Possibility of pregnancy. RISKS AND COMPLICATIONS Generally, this is a safe procedure. However, as with any procedure, complications can occur. Possible complications include: Bleeding. Pelvic infection. Puncture of the uterine wall with the biopsy device (rare). BEFORE THE PROCEDURE  Keep a record of your menstrual cycles as directed by your health care provider. You may need to schedule your procedure for a specific time in your cycle. You may want to bring a sanitary pad to wear home after the procedure. Arrange for someone to drive you home after the procedure if you will be given a medicine to help you relax (sedative). PROCEDURE  You may be given a sedative to relax you. You will lie on an exam table with your feet and legs supported as in a pelvic exam. Your health care provider will insert an instrument (speculum) into your vagina to see your cervix. Your cervix will be cleansed with an antiseptic solution. A medicine (local anesthetic) will be used to numb the cervix. A forceps instrument (tenaculum) will be used to hold your cervix steady for the biopsy. A thin, rodlike instrument (uterine sound) will be inserted through your cervix to determine the length of your uterus and the location where the biopsy sample will be removed. A thin, flexible tube (catheter) will be inserted through your cervix and into the uterus. The catheter is used to collect the biopsy sample from your endometrial tissue. The catheter and speculum will then be removed, and the tissue sample will be sent to a lab for examination. AFTER THE PROCEDURE You will rest in a recovery area until you are ready to go home. You may have mild cramping and a small amount of vaginal bleeding for a few days after the procedure. This is normal. Make sure you find out how to get your test results. Document Released: 05/29/2004 Document Revised: 09/28/2012 Document Reviewed:  07/13/2012 [facility] Patient Information 2015 [place], [place]. This information is not intended to replace advice given to you by your health care provider. Make sure you discuss any questions you have with your health care provider. Fluconazole injection What is this medicine? FLUCONAZOLE (floo KON na zole) is an antifungal medicine. It is used to treat or prevent certain kinds of fungal or yeast infections. This medicine may be used for other purposes; ask your health care provider or pharmacist if you have questions. COMMON BRAND NAME(S): Diflucan What should I tell my health care provider before I take this medicine? They need to know if you have any of these conditions: -history of irregular heart beat -kidney disease -an unusual or allergic reaction to fluconazole, other antifungal medicines, foods, dyes or preservatives -pregnant or trying to get pregnant -breast-feeding How should I use this medicine? This medicine is for injection into a vein. It is usually given by a health care  professional in a hospital or clinic setting. If you get this medicine at home, you will be taught how to prepare and give this medicine. Use exactly as directed. Take your medicine at regular intervals. Do not take your medicine more often than directed. It is important that you put your used needles and syringes in a special sharps container. Do not put them in a trash can. If you do not have a sharps container, call your pharmacist or healthcare provider to get one. Talk to your pediatrician regarding the use of this medicine in children. Special care may be needed. Overdosage: If you think you have taken too much of this medicine contact a poison control center or emergency room at once. NOTE: This medicine is only for you. Do not share this medicine with others. What if I miss a dose? This does not apply. What may interact with this medicine? Do not take this medicine with any of the following  medications: -cisapride -pimozide -red yeast rice This medicine may also interact with the following medications: -birth control pills -cyclosporine -diuretics like hydrochlorothiazide -medicines for diabetes that are taken by mouth -medicines for high cholesterol like atorvastatin, lovastatin or simvastatin -phenytoin -ramelteon -rifabutin -rifampin -some medicines for anxiety or sleep -tacrolimus -terfenadine -theophylline -tofacitinib -warfarin This list may not describe all possible interactions. Give your health care provider a list of all the medicines, herbs, non-prescription drugs, or dietary supplements you use. Also tell them if you smoke, drink alcohol, or use illegal drugs. Some items may interact with your medicine. What should I watch for while using this medicine? Tell your doctor if your symptoms do not improve. If you are taking this medicine for a long time you may need blood work. Some fungal infections need many weeks or months of treatment to cure completely. Alcohol can increase possible damage to your liver from this medicine. Avoid alcoholic drinks. What side effects may I notice from receiving this medicine? Side effects that you should report to your doctor or health care professional as soon as possible: -allergic reactions like skin rash or itching, hives, swelling of the lips, mouth, tongue, or throat -dark urine -feeling dizzy or faint -irregular heartbeat or chest pain -pain, redness at site of injection -redness, blistering, peeling or loosening of the skin, including inside the mouth -stomach pain -trouble breathing -unusual bruising or bleeding -vomiting -yellowing of the eyes or skin Side effects that usually do not require medical attention (report to your doctor or health care professional if they continue or are bothersome): -changes in how food tastes -diarrhea -headache -stomach upset, nausea This list may not describe all possible  side effects. Call your doctor for medical advice about side effects. You may report side effects to FDA at 1-800-FDA-1088. Where should I keep my medicine? Keep out of the reach of children. If you are using this medicine at home, you will be instructed on how to store this medicine. Throw away any unused medicine after the expiration date on the label. NOTE: This sheet is a summary. It may not cover all possible information. If you have questions about this medicine, talk to your doctor, pharmacist, or health care provider.  2015, Elsevier/Gold Standard. (2012-09-03 15:51:41) Monilial Vaginitis Vaginitis in a soreness, swelling and redness (inflammation) of the vagina and vulva. Monilial vaginitis is not a sexually transmitted infection. CAUSES  Yeast vaginitis is caused by yeast (candida) that is normally found in your vagina. With a yeast infection, the candida has overgrown in number  to a point that upsets the chemical balance. SYMPTOMS  White, thick vaginal discharge. Swelling, itching, redness and irritation of the vagina and possibly the lips of the vagina (vulva). Burning or painful urination. Painful intercourse. DIAGNOSIS  Things that may contribute to monilial vaginitis are: Postmenopausal and virginal states. Pregnancy. Infections. Being tired, sick or stressed, especially if you had monilial vaginitis in the past. Diabetes. Good control will help lower the chance. Birth control pills. Tight fitting garments. Using bubble bath, feminine sprays, douches or deodorant tampons. Taking certain medications that kill germs (antibiotics). Sporadic recurrence can occur if you become ill. TREATMENT  Your caregiver will give you medication. There are several kinds of anti monilial vaginal creams and suppositories specific for monilial vaginitis. For recurrent yeast infections, use a suppository or cream in the vagina 2 times a week, or as directed. Anti-monilial or steroid cream for  the itching or irritation of the vulva may also be used. Get your caregiver's permission. Painting the vagina with methylene blue solution may help if the monilial cream does not work. Eating yogurt may help prevent monilial vaginitis. HOME CARE INSTRUCTIONS  Finish all medication as prescribed. Do not have sex until treatment is completed or after your caregiver tells you it is okay. Take warm sitz baths. Do not douche. Do not use tampons, especially scented ones. Wear cotton underwear. Avoid tight pants and panty hose. Tell your sexual partner that you have a yeast infection. They should go to their caregiver if they have symptoms such as mild rash or itching. Your sexual partner should be treated as well if your infection is difficult to eliminate. Practice safer sex. Use condoms. Some vaginal medications cause latex condoms to fail. Vaginal medications that harm condoms are: Cleocin cream. Butoconazole (Femstat). Terconazole (Terazol) vaginal suppository. Miconazole (Monistat) (may be purchased over the counter). SEEK MEDICAL CARE IF:  You have a temperature by mouth above 102 F (38.9 C). The infection is getting worse after 2 days of treatment. The infection is not getting better after 3 days of treatment. You develop blisters in or around your vagina. You develop vaginal bleeding, and it is not your menstrual period. You have pain when you urinate. You develop intestinal problems. You have pain with sexual intercourse. Document Released: 11/05/2004 Document Revised: 04/20/2011 Document Reviewed: 07/20/2008 [facility] Patient Information 2015 [place], [place]. This information is not intended to replace advice given to you by your health care provider. Make sure you discuss any questions you have with your health care provider.

## 2014-03-20 NOTE — Progress Notes (Signed)
Tasha Anderson 1965-03-20 [id number]   History:    49 y.o.  for annual gyn exam who lost her husband is a result of colon cancer September 2015 but she is coping well. Patient was complaining of a slight vaginal discharge as well as having had 2 menstrual cycles in the month of January each lasting about 7 days. Her husband has had a vasectomy but she would like to start on oral contraceptive pill. Patient with no past history of any abnormal Pap smears. Her PCP Dr.Badger has been doing her blood work. She is taking her calcium and vitamin D and exercises regularly.  Past medical history,surgical history, family history and social history were all reviewed and documented in the EPIC chart.  Gynecologic History Patient's last menstrual period was 03/11/2014. Contraception: none Last Pap: 2012. Results were: normal Last mammogram: 2015. Results were: normal  Obstetric History OB History  Gravida Para Term Preterm AB SAB TAB Ectopic Multiple Living  2 2        2     # Outcome Date GA Lbr Len/2nd Weight Sex Delivery Anes PTL Lv  2 Para           1 Para                ROS: A ROS was performed and pertinent positives and negatives are included in the history.  GENERAL: No fevers or chills. HEENT: No change in vision, no earache, sore throat or sinus congestion. NECK: No pain or stiffness. CARDIOVASCULAR: No chest pain or pressure. No palpitations. PULMONARY: No shortness of breath, cough or wheeze. GASTROINTESTINAL: No abdominal pain, nausea, vomiting or diarrhea, melena or bright red blood per rectum. GENITOURINARY: No urinary frequency, urgency, hesitancy or dysuria. MUSCULOSKELETAL: No joint or muscle pain, no back pain, no recent trauma. DERMATOLOGIC: No rash, no itching, no lesions. ENDOCRINE: No polyuria, polydipsia, no heat or cold intolerance. No recent change in weight. HEMATOLOGICAL: No anemia or easy bruising or bleeding. NEUROLOGIC: No headache, seizures, numbness, tingling or  weakness. PSYCHIATRIC: No depression, no loss of interest in normal activity or change in sleep pattern.     Exam: chaperone present  BP 130/84 mmHg  Ht 5' 11.75" (1.822 m)  Wt 175 lb (79.379 kg)  BMI 23.91 kg/m2  LMP 03/11/2014  Body mass index is 23.91 kg/(m^2).  General appearance : Well developed well nourished female. No acute distress HEENT: Neck supple, trachea midline, no carotid bruits, no thyroidmegaly Lungs: Clear to auscultation, no rhonchi or wheezes, or rib retractions  Heart: Regular rate and rhythm, no murmurs or gallops Breast:Examined in sitting and supine position were symmetrical in appearance, no palpable masses or tenderness,  no skin retraction, no nipple inversion, no nipple discharge, no skin discoloration, no axillary or supraclavicular lymphadenopathy Abdomen: no palpable masses or tenderness, no rebound or guarding Extremities: no edema or skin discoloration or tenderness  Pelvic:  Bartholin, Urethra, Skene Glands: Within normal limits             Vagina: Creamy gray discharge  Cervix: No gross lesions or discharge  Uterus  anteverted, normal size, shape and consistency, non-tender and mobile  Adnexa  Without masses or tenderness  Anus and perineum  normal   Rectovaginal  normal sphincter tone without palpated masses or tenderness             Hemoccult not indicated    Wet prep: Yeast   Assessment/Plan:  49 y.o. female for annual exam will be started  on low low Estrin 28 day oral contraceptive pill with the start of her next cycle. Patient many years ago had been on oral contraceptive pills. Patient nonsmoker. Patient denies any family history of any clotting disorders. This low dose estrogen pill will help her transition into the menopause. Risk benefits and pros and cons were discussed. Pap smear was done today. PCP we'll be doing her blood work.. She will be prescribed Diflucan 150 mg 1 by mouth today for her yeast infection. She'll return back to  the office in 1-2 weeks for sonohysterogram and endometrial biopsy. Patient with history of mild hypertension on medication. This is one of the reasons I'm putting her on the 10 g oral contraceptive pill and will continue to monitor her blood pressures.   Terrance Mass MD, 4:01 PM 03/20/2014

## 2014-03-22 LAB — CYTOLOGY - PAP

## 2014-03-23 ENCOUNTER — Telehealth: Payer: Self-pay | Admitting: Gynecology

## 2014-03-23 NOTE — Telephone Encounter (Signed)
03/23/14-Pt was advised today that her cost for her upcoming sonohysterogram will be $1096.09. She said she will pay it all the day of the test. Her [employer] ins has a $1500.00 deductible and she has not met any of it yet.wl

## 2014-03-27 ENCOUNTER — Other Ambulatory Visit: Payer: Self-pay | Admitting: Gynecology

## 2014-03-27 DIAGNOSIS — N938 Other specified abnormal uterine and vaginal bleeding: Secondary | ICD-10-CM

## 2014-03-29 ENCOUNTER — Other Ambulatory Visit: Payer: Self-pay | Admitting: Gynecology

## 2014-03-29 ENCOUNTER — Encounter: Payer: Self-pay | Admitting: Gynecology

## 2014-03-29 ENCOUNTER — Ambulatory Visit (INDEPENDENT_AMBULATORY_CARE_PROVIDER_SITE_OTHER): Payer: BLUE CROSS/BLUE SHIELD | Admitting: Gynecology

## 2014-03-29 ENCOUNTER — Ambulatory Visit (INDEPENDENT_AMBULATORY_CARE_PROVIDER_SITE_OTHER): Payer: BLUE CROSS/BLUE SHIELD

## 2014-03-29 DIAGNOSIS — N938 Other specified abnormal uterine and vaginal bleeding: Secondary | ICD-10-CM

## 2014-03-29 DIAGNOSIS — N831 Corpus luteum cyst of ovary, unspecified side: Secondary | ICD-10-CM

## 2014-03-29 NOTE — Progress Notes (Signed)
   Patient presented to the office today for further evaluation of her intermenstrual bleeding episode that she experienced back in January. She stated that each lasted about 7 days. Her husband passed away in 12-01-2013 who had a vasectomy and she's currently not sexually active. She is about to start a low dose oral contraceptive pill low low Estrin with the start of her upcoming cycle.  Ultrasound/sono hysterogram today: Uterus measured 9.5 x 6.2 x 4.6 cm with endometrial stripe of 6.9 mm. (Last menstrual period 03/11/2014). Right ovary had a corpus luteum cyst measuring 18 x 19 mm color-flow on the periphery was noted. A thinwall cyst reticular echo pattern 19 x 18 mm negative negative color flow. Left ovary normal.  The cervix was cleansed with Betadine solution a sterile Pipelle was introduced into the uterine cavity and normal saline was instilled. No abnormality was noted in the cavity.  Pap smear done recently this year was normal.  Assessment/plan: Isolated event of irregular uterine bleeding in the month of January. Negative sonohysterogram. Results of endometrial biopsy pending at time of this dictation. A biopsy was normal she may start her oral contraceptive pill. We'll continue to monitor.

## 2014-04-04 ENCOUNTER — Telehealth: Payer: Self-pay | Admitting: *Deleted

## 2014-04-04 NOTE — Telephone Encounter (Signed)
Please inform patient and endometrial biopsy was benign

## 2014-04-04 NOTE — Telephone Encounter (Signed)
Pt called for Bx results on 03/29/14. Please advise

## 2014-04-04 NOTE — Telephone Encounter (Signed)
Left the below on pt cell #

## 2014-04-09 ENCOUNTER — Other Ambulatory Visit: Payer: Self-pay

## 2014-04-09 MED ORDER — NORETHIN-ETH ESTRAD-FE BIPHAS 1 MG-10 MCG / 10 MCG PO TABS: 1.0000 | ORAL_TABLET | Freq: Every day | ORAL | Status: DC

## 2014-04-11 ENCOUNTER — Other Ambulatory Visit: Payer: Self-pay

## 2014-04-11 MED ORDER — NORETHIN-ETH ESTRAD-FE BIPHAS 1 MG-10 MCG / 10 MCG PO TABS: 1.0000 | ORAL_TABLET | Freq: Every day | ORAL | Status: DC

## 2014-07-18 ENCOUNTER — Telehealth: Payer: Self-pay

## 2014-07-18 NOTE — Telephone Encounter (Signed)
Patient's husband passed away 10 mos ago. She had first intercourse in 2 years a few weeks ago. She bled during intercourse.  She now has itching and white discharge.  The left side of her labia is very irritated and burning. I recommended office visit. She will come at 9am tomorrow and see Elon Alas, NP.

## 2014-07-19 ENCOUNTER — Encounter: Payer: Self-pay | Admitting: Women's Health

## 2014-07-19 ENCOUNTER — Ambulatory Visit (INDEPENDENT_AMBULATORY_CARE_PROVIDER_SITE_OTHER): Payer: BLUE CROSS/BLUE SHIELD | Admitting: Women's Health

## 2014-07-19 VITALS — BP 130/82 | Ht 71.0 in | Wt 175.0 lb

## 2014-07-19 DIAGNOSIS — N898 Other specified noninflammatory disorders of vagina: Secondary | ICD-10-CM | POA: Diagnosis not present

## 2014-07-19 DIAGNOSIS — R350 Frequency of micturition: Secondary | ICD-10-CM | POA: Diagnosis not present

## 2014-07-19 DIAGNOSIS — Z113 Encounter for screening for infections with a predominantly sexual mode of transmission: Secondary | ICD-10-CM

## 2014-07-19 DIAGNOSIS — B3731 Acute candidiasis of vulva and vagina: Secondary | ICD-10-CM

## 2014-07-19 DIAGNOSIS — B373 Candidiasis of vulva and vagina: Secondary | ICD-10-CM | POA: Diagnosis not present

## 2014-07-19 LAB — URINALYSIS W MICROSCOPIC + REFLEX CULTURE
Bilirubin Urine: NEGATIVE
CRYSTALS: NONE SEEN
Casts: NONE SEEN
GLUCOSE, UA: NEGATIVE mg/dL
Hgb urine dipstick: NEGATIVE
Ketones, ur: NEGATIVE mg/dL
NITRITE: NEGATIVE
Protein, ur: NEGATIVE mg/dL
RBC / HPF: NONE SEEN RBC/hpf (ref <3)
Specific Gravity, Urine: 1.015 (ref 1.005–1.030)
Urobilinogen, UA: 0.2 mg/dL (ref 0.0–1.0)
pH: 6 (ref 5.0–8.0)

## 2014-07-19 LAB — WET PREP FOR TRICH, YEAST, CLUE
Clue Cells Wet Prep HPF POC: NONE SEEN
Trich, Wet Prep: NONE SEEN

## 2014-07-19 MED ORDER — FLUCONAZOLE 150 MG PO TABS: 150.0000 mg | ORAL_TABLET | Freq: Once | ORAL | Status: DC

## 2014-07-19 NOTE — Progress Notes (Signed)
Patient ID: Tasha Anderson, female   DOB: 04-May-1965, 49 y.o.   MRN: [id number] Presents with complaint of increased discharge with itching and irritation for the past week. Mild urinary frequency without pain or burning. New partner/husband died 10 months ago, ill for 3 years. Some irregular menses with lo Loestrin. Denies abdominal pain or fever.  Exam: Appears well. External genitalia mild erythema, speculum exam moderate amount of a white discharge with mild erythema. GC/Chlamydia culture taken. Wet prep positive for yeast. Bimanual no CMT or tenderness. UA: Small leukocytes, 7-10 WBCs, many bacteria  Yeast vaginitis STD screen  Plan: Continue lo Loestrin/condoms encouraged until permanent partner.Marland Kitchen GC/Chlamydia, HIV, hep B, C, RPR.  Diflucan 150 by mouth 1 dose with refill, instructed to call if no relief. Continue grief counseling as needed.

## 2014-07-19 NOTE — Patient Instructions (Signed)

## 2014-07-20 LAB — HIV ANTIBODY (ROUTINE TESTING W REFLEX): HIV: NONREACTIVE

## 2014-07-20 LAB — GC/CHLAMYDIA PROBE AMP
CT PROBE, AMP APTIMA: NEGATIVE
GC PROBE AMP APTIMA: NEGATIVE

## 2014-07-20 LAB — HEPATITIS C ANTIBODY: HCV AB: NEGATIVE

## 2014-07-20 LAB — RPR

## 2014-07-20 LAB — HEPATITIS B SURFACE ANTIGEN: Hepatitis B Surface Ag: NEGATIVE

## 2014-07-21 LAB — URINE CULTURE
Colony Count: NO GROWTH
Organism ID, Bacteria: NO GROWTH

## 2014-07-23 ENCOUNTER — Telehealth: Payer: Self-pay | Admitting: *Deleted

## 2014-07-23 MED ORDER — NORETHIN-ETH ESTRAD-FE BIPHAS 1 MG-10 MCG / 10 MCG PO TABS: 1.0000 | ORAL_TABLET | Freq: Every day | ORAL | Status: DC

## 2014-07-23 NOTE — Telephone Encounter (Signed)
Pt called requesting 1 pack of pills sent to local [facility] on [address]. Mail order pharmacy will not have Rx until after her vacation date on 07/26/14

## 2014-10-09 ENCOUNTER — Encounter: Payer: Self-pay | Admitting: Gynecology

## 2015-04-09 ENCOUNTER — Other Ambulatory Visit: Payer: Self-pay | Admitting: Gynecology

## 2015-06-26 ENCOUNTER — Ambulatory Visit (INDEPENDENT_AMBULATORY_CARE_PROVIDER_SITE_OTHER): Payer: BLUE CROSS/BLUE SHIELD | Admitting: Gynecology

## 2015-06-26 ENCOUNTER — Encounter: Payer: Self-pay | Admitting: Gynecology

## 2015-06-26 VITALS — BP 136/88 | Ht 71.0 in | Wt 175.0 lb

## 2015-06-26 DIAGNOSIS — Z01419 Encounter for gynecological examination (general) (routine) without abnormal findings: Secondary | ICD-10-CM | POA: Diagnosis not present

## 2015-06-26 NOTE — Progress Notes (Signed)
Tasha Anderson [birth year] [id number]   History:    50 y.o.  for annual gyn exam with no complaints today patient is currently on LO Loestrin oral contraceptive pill is having no menstrual cycle. Patient lost her husband to colon cancer in September 2015. She has been coping well. 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 Her PCP Dr.Badger has been doing her blood work. She is taking her calcium and vitamin D and exercises regularly.  Past medical history,surgical history, family history and social history were all reviewed and documented in the EPIC chart.  Gynecologic History No LMP recorded. Patient is not currently having periods (Reason: Oral contraceptives). Contraception: OCP (estrogen/progesterone) Last Pap: 2012 and 2016. Results were: normal Last mammogram: 2016. Results were: normal  Obstetric History OB History  Gravida Para Term Preterm AB SAB TAB Ectopic Multiple Living  2 2        2     # Outcome Date GA Lbr Len/2nd Weight Sex Delivery Anes PTL Lv  2 Para           1 Para                ROS: A ROS was performed and pertinent positives and negatives are included in the history.  GENERAL: No fevers or chills. HEENT: No change in vision, no earache, sore throat or sinus congestion. NECK: No pain or stiffness. CARDIOVASCULAR: No chest pain or pressure. No palpitations. PULMONARY: No shortness of breath, cough or wheeze. GASTROINTESTINAL: No abdominal pain, nausea, vomiting or diarrhea, melena or bright red blood per rectum. GENITOURINARY: No urinary frequency, urgency, hesitancy or dysuria. MUSCULOSKELETAL: No joint or muscle pain, no back pain, no recent trauma. DERMATOLOGIC: No rash, no itching, no lesions. ENDOCRINE: No polyuria, polydipsia, no heat or cold intolerance. No  recent change in weight. HEMATOLOGICAL: No anemia or easy bruising or bleeding. NEUROLOGIC: No headache, seizures, numbness, tingling or weakness. PSYCHIATRIC: No depression, no loss of interest in normal activity or change in sleep pattern.     Exam: chaperone present  BP 136/88 mmHg  Ht 5\' 11"  (1.803 m)  Wt 175 lb (79.379 kg)  BMI 24.42 kg/m2  Body mass index is 24.42 kg/(m^2).  General appearance : Well developed well nourished female. No acute distress HEENT: Eyes: no retinal hemorrhage or exudates,  Neck supple, trachea midline, no carotid bruits, no thyroidmegaly Lungs: Clear to auscultation, no rhonchi or wheezes, or rib retractions  Heart: Regular rate and rhythm, no murmurs or gallops Breast:Examined in sitting and supine position were symmetrical in appearance, no palpable masses or tenderness,  no skin retraction, no nipple inversion, no nipple discharge, no skin discoloration, no axillary or supraclavicular lymphadenopathy Abdomen: no palpable masses or tenderness, no rebound or guarding Extremities: no edema or skin discoloration or tenderness  Pelvic:  Bartholin, Urethra, Skene Glands: Within normal limits             Vagina: No gross lesions or discharge  Cervix: No gross lesions or discharge  Uterus  anteverted, normal size, shape and consistency, non-tender and mobile  Adnexa  Without masses or tenderness  Anus and perineum  normal   Rectovaginal  normal sphincter tone without palpated masses or tenderness             Hemoccult will schedule  colonoscopy this year     Assessment/Plan:  50 y.o. female for annual exam is having her blood drawn by her PCP. He is going to be scheduling her colonoscopy. She was reminded of the importance of monthly self breast examination. I have recommended that she stay on the low dose 10 g oral contraceptive pill as she transitions through the menopause and discontinued at the age of 33. Pap smear not indicated this year. We discussed  importance of calcium vitamin D and weightbearing exercises for osteoporosis prevention.   Terrance Mass MD, 5:11 PM 06/26/2015

## 2015-06-30 ENCOUNTER — Other Ambulatory Visit: Payer: Self-pay | Admitting: Gynecology

## 2015-07-01 NOTE — Telephone Encounter (Signed)
Per note on OV 06/26/15 "have recommended that she stay on the low dose 10 g oral contraceptive pill as she transitions through the menopause and discontinued at the age of 29" Rx will be sent.

## 2015-11-15 ENCOUNTER — Encounter: Payer: Self-pay | Admitting: Gynecology

## 2015-11-15 DIAGNOSIS — Z1231 Encounter for screening mammogram for malignant neoplasm of breast: Secondary | ICD-10-CM | POA: Diagnosis not present

## 2015-11-15 DIAGNOSIS — Z803 Family history of malignant neoplasm of breast: Secondary | ICD-10-CM | POA: Diagnosis not present

## 2016-01-16 DIAGNOSIS — L570 Actinic keratosis: Secondary | ICD-10-CM | POA: Diagnosis not present

## 2016-04-01 DIAGNOSIS — M545 Low back pain: Secondary | ICD-10-CM | POA: Diagnosis not present

## 2016-04-01 DIAGNOSIS — Z23 Encounter for immunization: Secondary | ICD-10-CM | POA: Diagnosis not present

## 2016-04-01 DIAGNOSIS — I10 Essential (primary) hypertension: Secondary | ICD-10-CM | POA: Diagnosis not present

## 2016-04-01 DIAGNOSIS — Z6823 Body mass index (BMI) 23.0-23.9, adult: Secondary | ICD-10-CM | POA: Diagnosis not present

## 2016-04-01 DIAGNOSIS — Z0000 Encounter for general adult medical examination without abnormal findings: Secondary | ICD-10-CM | POA: Diagnosis not present

## 2016-05-26 DIAGNOSIS — Z1211 Encounter for screening for malignant neoplasm of colon: Secondary | ICD-10-CM | POA: Diagnosis not present

## 2016-06-01 ENCOUNTER — Other Ambulatory Visit: Payer: Self-pay | Admitting: Gynecology

## 2016-06-24 ENCOUNTER — Encounter: Payer: Self-pay | Admitting: Gynecology

## 2016-08-25 ENCOUNTER — Other Ambulatory Visit: Payer: Self-pay | Admitting: Gynecology

## 2016-10-16 DIAGNOSIS — M545 Low back pain: Secondary | ICD-10-CM | POA: Diagnosis not present

## 2016-10-16 DIAGNOSIS — M5416 Radiculopathy, lumbar region: Secondary | ICD-10-CM | POA: Diagnosis not present

## 2016-10-16 DIAGNOSIS — M5136 Other intervertebral disc degeneration, lumbar region: Secondary | ICD-10-CM | POA: Diagnosis not present

## 2016-11-09 DIAGNOSIS — K1379 Other lesions of oral mucosa: Secondary | ICD-10-CM | POA: Diagnosis not present

## 2016-11-25 DIAGNOSIS — L570 Actinic keratosis: Secondary | ICD-10-CM | POA: Diagnosis not present

## 2016-11-25 DIAGNOSIS — Z85828 Personal history of other malignant neoplasm of skin: Secondary | ICD-10-CM | POA: Diagnosis not present

## 2016-11-25 DIAGNOSIS — L573 Poikiloderma of Civatte: Secondary | ICD-10-CM | POA: Diagnosis not present

## 2016-11-25 DIAGNOSIS — D225 Melanocytic nevi of trunk: Secondary | ICD-10-CM | POA: Diagnosis not present

## 2016-11-25 DIAGNOSIS — L905 Scar conditions and fibrosis of skin: Secondary | ICD-10-CM | POA: Diagnosis not present

## 2016-12-01 ENCOUNTER — Ambulatory Visit: Payer: BLUE CROSS/BLUE SHIELD | Attending: Specialist | Admitting: Physical Therapy

## 2016-12-01 DIAGNOSIS — M5440 Lumbago with sciatica, unspecified side: Secondary | ICD-10-CM | POA: Diagnosis not present

## 2016-12-01 DIAGNOSIS — M5416 Radiculopathy, lumbar region: Secondary | ICD-10-CM | POA: Diagnosis not present

## 2016-12-01 DIAGNOSIS — G8929 Other chronic pain: Secondary | ICD-10-CM

## 2016-12-01 DIAGNOSIS — M6281 Muscle weakness (generalized): Secondary | ICD-10-CM

## 2016-12-01 NOTE — Patient Instructions (Addendum)
Core handout from cabinet , introducing Transverse Abdominus and anatomy, lumbar stability I   2 times per day
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
## 2016-12-08 ENCOUNTER — Encounter: Payer: Self-pay | Admitting: Physical Therapy

## 2016-12-08 ENCOUNTER — Ambulatory Visit: Payer: BLUE CROSS/BLUE SHIELD | Admitting: Physical Therapy

## 2016-12-08 DIAGNOSIS — M5440 Lumbago with sciatica, unspecified side: Secondary | ICD-10-CM

## 2016-12-08 DIAGNOSIS — M5416 Radiculopathy, lumbar region: Secondary | ICD-10-CM | POA: Diagnosis not present

## 2016-12-08 DIAGNOSIS — G8929 Other chronic pain: Secondary | ICD-10-CM | POA: Diagnosis not present

## 2016-12-08 DIAGNOSIS — M6281 Muscle weakness (generalized): Secondary | ICD-10-CM | POA: Diagnosis not present
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
## 2016-12-08 NOTE — Patient Instructions (Signed)
Shoulder bridge from cabinet

## 2016-12-10 ENCOUNTER — Ambulatory Visit (INDEPENDENT_AMBULATORY_CARE_PROVIDER_SITE_OTHER): Payer: BLUE CROSS/BLUE SHIELD | Admitting: Obstetrics & Gynecology

## 2016-12-10 ENCOUNTER — Encounter: Payer: Self-pay | Admitting: Obstetrics & Gynecology

## 2016-12-10 VITALS — BP 112/70 | Ht 71.0 in | Wt 172.0 lb

## 2016-12-10 DIAGNOSIS — Z01419 Encounter for gynecological examination (general) (routine) without abnormal findings: Secondary | ICD-10-CM

## 2016-12-10 DIAGNOSIS — Z780 Asymptomatic menopausal state: Secondary | ICD-10-CM

## 2016-12-10 NOTE — Patient Instructions (Signed)
1. Encounter for routine gynecological examination with Papanicolaou smear of cervix Normal gyn exam.  Pap reflex done.  Breasts wnl.  Screening 3D Mammo to schedule.  2. Menopause present Declines HRT.  Menopausal symptoms mild currently and improving.  Probably just entering menopause.  Will observe.  Vit D supplements/Ca++ in food/Weight bearing physical activity.  Will do a Bone Density next year.  Tasha Anderson, it was a pleasure meeting you today!  I will inform you of your results as soon as available.   Health Maintenance for Postmenopausal Women Menopause is a normal process in which your reproductive ability comes to an end. This process happens gradually over a span of months to years, usually between the ages of 64 and 67. Menopause is complete when you have missed 12 consecutive menstrual periods. It is important to talk with your health care provider about some of the most common conditions that affect postmenopausal women, such as heart disease, cancer, and bone loss (osteoporosis). Adopting a healthy lifestyle and getting preventive care can help to promote your health and wellness. Those actions can also lower your chances of developing some of these common conditions. What should I know about menopause? During menopause, you may experience a number of symptoms, such as:  Moderate-to-severe hot flashes.  Night sweats.  Decrease in sex drive.  Mood swings.  Headaches.  Tiredness.  Irritability.  Memory problems.  Insomnia.  Choosing to treat or not to treat menopausal changes is an individual decision that you make with your health care provider. What should I know about hormone replacement therapy and supplements? Hormone therapy products are effective for treating symptoms that are associated with menopause, such as hot flashes and night sweats. Hormone replacement carries certain risks, especially as you become older. If you are thinking about using estrogen or estrogen  with progestin treatments, discuss the benefits and risks with your health care provider. What should I know about heart disease and stroke? Heart disease, heart attack, and stroke become more likely as you age. This may be due, in part, to the hormonal changes that your body experiences during menopause. These can affect how your body processes dietary fats, triglycerides, and cholesterol. Heart attack and stroke are both medical emergencies. There are many things that you can do to help prevent heart disease and stroke:  Have your blood pressure checked at least every 1-2 years. High blood pressure causes heart disease and increases the risk of stroke.  If you are 40-33 years old, ask your health care provider if you should take aspirin to prevent a heart attack or a stroke.  Do not use any tobacco products, including cigarettes, chewing tobacco, or electronic cigarettes. If you need help quitting, ask your health care provider.  It is important to eat a healthy diet and maintain a healthy weight. ? Be sure to include plenty of vegetables, fruits, low-fat dairy products, and lean protein. ? Avoid eating foods that are high in solid fats, added sugars, or salt (sodium).  Get regular exercise. This is one of the most important things that you can do for your health. ? Try to exercise for at least 150 minutes each week. The type of exercise that you do should increase your heart rate and make you sweat. This is known as moderate-intensity exercise. ? Try to do strengthening exercises at least twice each week. Do these in addition to the moderate-intensity exercise.  Know your numbers.Ask your health care provider to check your cholesterol and your blood glucose.  Continue to have your blood tested as directed by your health care provider.  What should I know about cancer screening? There are several types of cancer. Take the following steps to reduce your risk and to catch any cancer development  as early as possible. Breast Cancer  Practice breast self-awareness. ? This means understanding how your breasts normally appear and feel. ? It also means doing regular breast self-exams. Let your health care provider know about any changes, no matter how small.  If you are 72 or older, have a clinician do a breast exam (clinical breast exam or CBE) every year. Depending on your age, family history, and medical history, it may be recommended that you also have a yearly breast X-ray (mammogram).  If you have a family history of breast cancer, talk with your health care provider about genetic screening.  If you are at high risk for breast cancer, talk with your health care provider about having an MRI and a mammogram every year.  Breast cancer (BRCA) gene test is recommended for women who have family members with BRCA-related cancers. Results of the assessment will determine the need for genetic counseling and BRCA1 and for BRCA2 testing. BRCA-related cancers include these types: ? Breast. This occurs in males or females. ? Ovarian. ? Tubal. This may also be called fallopian tube cancer. ? Cancer of the abdominal or pelvic lining (peritoneal cancer). ? Prostate. ? Pancreatic.  Cervical, Uterine, and Ovarian Cancer Your health care provider may recommend that you be screened regularly for cancer of the pelvic organs. These include your ovaries, uterus, and vagina. This screening involves a pelvic exam, which includes checking for microscopic changes to the surface of your cervix (Pap test).  For women ages 21-65, health care providers may recommend a pelvic exam and a Pap test every three years. For women ages 51-65, they may recommend the Pap test and pelvic exam, combined with testing for human papilloma virus (HPV), every five years. Some types of HPV increase your risk of cervical cancer. Testing for HPV may also be done on women of any age who have unclear Pap test results.  Other health  care providers may not recommend any screening for nonpregnant women who are considered low risk for pelvic cancer and have no symptoms. Ask your health care provider if a screening pelvic exam is right for you.  If you have had past treatment for cervical cancer or a condition that could lead to cancer, you need Pap tests and screening for cancer for at least 20 years after your treatment. If Pap tests have been discontinued for you, your risk factors (such as having a new sexual partner) need to be reassessed to determine if you should start having screenings again. Some women have medical problems that increase the chance of getting cervical cancer. In these cases, your health care provider may recommend that you have screening and Pap tests more often.  If you have a family history of uterine cancer or ovarian cancer, talk with your health care provider about genetic screening.  If you have vaginal bleeding after reaching menopause, tell your health care provider.  There are currently no reliable tests available to screen for ovarian cancer.  Lung Cancer Lung cancer screening is recommended for adults 16-107 years old who are at high risk for lung cancer because of a history of smoking. A yearly low-dose CT scan of the lungs is recommended if you:  Currently smoke.  Have a history of at least  30 pack-years of smoking and you currently smoke or have quit within the past 15 years. A pack-year is smoking an average of one pack of cigarettes per day for one year.  Yearly screening should:  Continue until it has been 15 years since you quit.  Stop if you develop a health problem that would prevent you from having lung cancer treatment.  Colorectal Cancer  This type of cancer can be detected and can often be prevented.  Routine colorectal cancer screening usually begins at age 42 and continues through age 68.  If you have risk factors for colon cancer, your health care provider may  recommend that you be screened at an earlier age.  If you have a family history of colorectal cancer, talk with your health care provider about genetic screening.  Your health care provider may also recommend using home test kits to check for hidden blood in your stool.  A small camera at the end of a tube can be used to examine your colon directly (sigmoidoscopy or colonoscopy). This is done to check for the earliest forms of colorectal cancer.  Direct examination of the colon should be repeated every 5-10 years until age 2. However, if early forms of precancerous polyps or small growths are found or if you have a family history or genetic risk for colorectal cancer, you may need to be screened more often.  Skin Cancer  Check your skin from head to toe regularly.  Monitor any moles. Be sure to tell your health care provider: ? About any new moles or changes in moles, especially if there is a change in a mole's shape or color. ? If you have a mole that is larger than the size of a pencil eraser.  If any of your family members has a history of skin cancer, especially at a young age, talk with your health care provider about genetic screening.  Always use sunscreen. Apply sunscreen liberally and repeatedly throughout the day.  Whenever you are outside, protect yourself by wearing long sleeves, pants, a wide-brimmed hat, and sunglasses.  What should I know about osteoporosis? Osteoporosis is a condition in which bone destruction happens more quickly than new bone creation. After menopause, you may be at an increased risk for osteoporosis. To help prevent osteoporosis or the bone fractures that can happen because of osteoporosis, the following is recommended:  If you are 82-43 years old, get at least 1,000 mg of calcium and at least 600 mg of vitamin D per day.  If you are older than age 57 but younger than age 33, get at least 1,200 mg of calcium and at least 600 mg of vitamin D per  day.  If you are older than age 75, get at least 1,200 mg of calcium and at least 800 mg of vitamin D per day.  Smoking and excessive alcohol intake increase the risk of osteoporosis. Eat foods that are rich in calcium and vitamin D, and do weight-bearing exercises several times each week as directed by your health care provider. What should I know about how menopause affects my mental health? Depression may occur at any age, but it is more common as you become older. Common symptoms of depression include:  Low or sad mood.  Changes in sleep patterns.  Changes in appetite or eating patterns.  Feeling an overall lack of motivation or enjoyment of activities that you previously enjoyed.  Frequent crying spells.  Talk with your health care provider if you think  that you are experiencing depression. What should I know about immunizations? It is important that you get and maintain your immunizations. These include:  Tetanus, diphtheria, and pertussis (Tdap) booster vaccine.  Influenza every year before the flu season begins.  Pneumonia vaccine.  Shingles vaccine.  Your health care provider may also recommend other immunizations. This information is not intended to replace advice given to you by your health care provider. Make sure you discuss any questions you have with your health care provider. Document Released: 03/20/2005 Document Revised: 08/16/2015 Document Reviewed: 10/30/2014 Elsevier Interactive Patient Education  2018 [employer]  

## 2016-12-10 NOTE — Addendum Note (Signed)
Addended by: Thurnell Garbe A on: 12/10/2016 12:29 PM   Modules accepted: Orders

## 2016-12-10 NOTE — Progress Notes (Signed)
Tasha Anderson 11/24/1965 [id number]   History:    52 y.o. G2P2L2 Widowed.  Son at [employer], Daughter Senior at [employer].  RP:  Established patient presenting for annual gyn exam  HPI:  D/ced BCPs in 08/2016, ran out.  No withdrawal bleeding and no menses since.  Hot flushes present, but tolerable.  Not drenched at night, just a little warm.  Abstinent.  No pelvic pain.  Normal vaginal secretions.  Breasts wnl.  Will schedule Mammo at [facility].  Mictions/BMs wnl.  Had a Basal cell Ca Rt shoulder and Squamous Cell Ca chest, followed by [facility] every 6 months.  Past medical history,surgical history, family history and social history were all reviewed and documented in the EPIC chart.  Gynecologic History Patient's last menstrual period was 08/09/2016. Contraception: abstinence Last Pap: 03/2014. Results were: Negative Last mammogram: 11/2015. Results were: Negative [facility] 2018  Obstetric History OB History  Gravida Para Term Preterm AB Living  2 2       2   SAB TAB Ectopic Multiple Live Births               # Outcome Date GA Lbr Len/2nd Weight Sex Delivery Anes PTL Lv  2 Para           1 Para                ROS: A ROS was performed and pertinent positives and negatives are included in the history.  GENERAL: No fevers or chills. HEENT: No change in vision, no earache, sore throat or sinus congestion. NECK: No pain or stiffness. CARDIOVASCULAR: No chest pain or pressure. No palpitations. PULMONARY: No shortness of breath, cough or wheeze. GASTROINTESTINAL: No abdominal pain, nausea, vomiting or diarrhea, melena or bright red blood per rectum. GENITOURINARY: No urinary frequency, urgency, hesitancy or dysuria. MUSCULOSKELETAL: No joint or muscle pain, no back pain, no recent trauma. DERMATOLOGIC: No rash, no itching, no lesions. ENDOCRINE: No polyuria, polydipsia, no heat or cold intolerance. No recent change in weight. HEMATOLOGICAL: No anemia or easy bruising or bleeding.  NEUROLOGIC: No headache, seizures, numbness, tingling or weakness. PSYCHIATRIC: No depression, no loss of interest in normal activity or change in sleep pattern.     Exam:   BP 112/70   Ht 5\' 11"  (1.803 m)   Wt 172 lb (78 kg)   LMP 08/09/2016   BMI 23.99 kg/m   Body mass index is 23.99 kg/m.  General appearance : Well developed well nourished female. No acute distress HEENT: Eyes: no retinal hemorrhage or exudates,  Neck supple, trachea midline, no carotid bruits, no thyroidmegaly Lungs: Clear to auscultation, no rhonchi or wheezes, or rib retractions  Heart: Regular rate and rhythm, no murmurs or gallops Breast:Examined in sitting and supine position were symmetrical in appearance, no palpable masses or tenderness,  no skin retraction, no nipple inversion, no nipple discharge, no skin discoloration, no axillary or supraclavicular lymphadenopathy Abdomen: no palpable masses or tenderness, no rebound or guarding Extremities: no edema or skin discoloration or tenderness  Pelvic: Vulva normal  Bartholin, Urethra, Skene Glands: Within normal limits             Vagina: No gross lesions or discharge  Cervix: No gross lesions or discharge.  Pap reflex done.  Uterus  AV, normal size, shape and consistency, non-tender and mobile  Adnexa  Without masses or tenderness  Anus and perineum  normal     Assessment/Plan:  51 y.o. female for annual  exam   1. Encounter for routine gynecological examination with Papanicolaou smear of cervix Normal gyn exam.  Pap reflex done.  Breasts wnl.  Screening 3D Mammo to schedule.  2. Menopause present Declines HRT.  Menopausal symptoms mild currently and improving.  Probably just entering menopause.  Will observe.  Vit D supplements/Ca++ in food/Weight bearing physical activity.  Will do a Bone Density next year.  [name] MD, 11:58 AM 12/10/2016

## 2016-12-14 LAB — PAP IG W/ RFLX HPV ASCU

## 2016-12-15 ENCOUNTER — Encounter: Payer: BLUE CROSS/BLUE SHIELD | Admitting: Physical Therapy

## 2016-12-17 ENCOUNTER — Ambulatory Visit: Payer: BLUE CROSS/BLUE SHIELD | Attending: Specialist | Admitting: Physical Therapy

## 2016-12-17 DIAGNOSIS — M5440 Lumbago with sciatica, unspecified side: Secondary | ICD-10-CM | POA: Diagnosis not present

## 2016-12-17 DIAGNOSIS — M5416 Radiculopathy, lumbar region: Secondary | ICD-10-CM | POA: Insufficient documentation

## 2016-12-17 DIAGNOSIS — G8929 Other chronic pain: Secondary | ICD-10-CM | POA: Diagnosis not present

## 2016-12-17 DIAGNOSIS — M6281 Muscle weakness (generalized): Secondary | ICD-10-CM | POA: Insufficient documentation
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
## 2016-12-22 ENCOUNTER — Encounter: Payer: Self-pay | Admitting: Physical Therapy

## 2016-12-22 ENCOUNTER — Ambulatory Visit: Payer: BLUE CROSS/BLUE SHIELD | Admitting: Physical Therapy

## 2016-12-22 DIAGNOSIS — G8929 Other chronic pain: Secondary | ICD-10-CM

## 2016-12-22 DIAGNOSIS — M6281 Muscle weakness (generalized): Secondary | ICD-10-CM | POA: Diagnosis not present

## 2016-12-22 DIAGNOSIS — M5416 Radiculopathy, lumbar region: Secondary | ICD-10-CM | POA: Diagnosis not present

## 2016-12-22 DIAGNOSIS — M5440 Lumbago with sciatica, unspecified side: Secondary | ICD-10-CM | POA: Diagnosis not present
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
## 2016-12-24 ENCOUNTER — Encounter: Payer: Self-pay | Admitting: Physical Therapy

## 2016-12-24 ENCOUNTER — Ambulatory Visit: Payer: BLUE CROSS/BLUE SHIELD | Admitting: Physical Therapy

## 2016-12-24 DIAGNOSIS — G8929 Other chronic pain: Secondary | ICD-10-CM

## 2016-12-24 DIAGNOSIS — M6281 Muscle weakness (generalized): Secondary | ICD-10-CM | POA: Diagnosis not present

## 2016-12-24 DIAGNOSIS — M5416 Radiculopathy, lumbar region: Secondary | ICD-10-CM | POA: Diagnosis not present

## 2016-12-24 DIAGNOSIS — M5440 Lumbago with sciatica, unspecified side: Secondary | ICD-10-CM | POA: Diagnosis not present

## 2016-12-24 NOTE — Patient Instructions (Signed)
Trigger Point Dry Needling  . What is Trigger Point Dry Needling (DN)? o DN is a physical therapy technique used to treat muscle pain and dysfunction. Specifically, DN helps deactivate muscle trigger points (muscle knots).  o A thin filiform needle is used to penetrate the skin and stimulate the underlying trigger point. The goal is for a local twitch response (LTR) to occur and for the trigger point to relax. No medication of any kind is injected during the procedure.   . What Does Trigger Point Dry Needling Feel Like?  o The procedure feels different for each individual patient. Some patients report that they do not actually feel the needle enter the skin and overall the process is not painful. Very mild bleeding may occur. However, many patients feel a deep cramping in the muscle in which the needle was inserted. This is the local twitch response.   Marland Kitchen How Will I feel after the treatment? o Soreness is normal, and the onset of soreness may not occur for a few hours. Typically this soreness does not last longer than two days.  o Bruising is uncommon, however; ice can be used to decrease any possible bruising.  o In rare cases feeling tired or nauseous after the treatment is normal. In addition, your symptoms may get worse before they get better, this period will typically not last longer than 24 hours.   . What Can I do After My Treatment? o Increase your hydration by drinking more water for the next 24 hours. o You may place ice or heat on the areas treated that have become sore, however, do not use heat on inflamed or bruised areas. Heat often brings more relief post needling. o You can continue your regular activities, but vigorous activity is not recommended initially after the treatment for 24 hours. o DN is best combined with other physical therapy such as strengthening, stretching, and other therapies.   Tasha Anderson, PT Certified Exercise Expert for the Aging Adult  12/24/16 11:13  AM Phone: [phone] Fax: [phone]
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
## 2016-12-29 ENCOUNTER — Ambulatory Visit: Payer: BLUE CROSS/BLUE SHIELD | Admitting: Physical Therapy

## 2016-12-29 ENCOUNTER — Encounter: Payer: Self-pay | Admitting: Physical Therapy

## 2016-12-29 DIAGNOSIS — G8929 Other chronic pain: Secondary | ICD-10-CM

## 2016-12-29 DIAGNOSIS — M5440 Lumbago with sciatica, unspecified side: Secondary | ICD-10-CM | POA: Diagnosis not present

## 2016-12-29 DIAGNOSIS — M5416 Radiculopathy, lumbar region: Secondary | ICD-10-CM | POA: Diagnosis not present

## 2016-12-29 DIAGNOSIS — M6281 Muscle weakness (generalized): Secondary | ICD-10-CM
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
## 2017-01-05 ENCOUNTER — Ambulatory Visit: Payer: BLUE CROSS/BLUE SHIELD | Admitting: Physical Therapy

## 2017-01-05 ENCOUNTER — Encounter: Payer: Self-pay | Admitting: Physical Therapy

## 2017-01-05 DIAGNOSIS — M5440 Lumbago with sciatica, unspecified side: Secondary | ICD-10-CM | POA: Diagnosis not present

## 2017-01-05 DIAGNOSIS — M5416 Radiculopathy, lumbar region: Secondary | ICD-10-CM | POA: Diagnosis not present

## 2017-01-05 DIAGNOSIS — M6281 Muscle weakness (generalized): Secondary | ICD-10-CM

## 2017-01-05 DIAGNOSIS — G8929 Other chronic pain: Secondary | ICD-10-CM | POA: Diagnosis not present
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
## 2017-01-08 ENCOUNTER — Ambulatory Visit: Payer: BLUE CROSS/BLUE SHIELD | Admitting: Physical Therapy

## 2017-01-08 DIAGNOSIS — M6281 Muscle weakness (generalized): Secondary | ICD-10-CM | POA: Diagnosis not present

## 2017-01-08 DIAGNOSIS — M5440 Lumbago with sciatica, unspecified side: Secondary | ICD-10-CM

## 2017-01-08 DIAGNOSIS — G8929 Other chronic pain: Secondary | ICD-10-CM

## 2017-01-08 DIAGNOSIS — M5416 Radiculopathy, lumbar region: Secondary | ICD-10-CM
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
## 2017-01-08 NOTE — Patient Instructions (Signed)

## 2017-01-15 ENCOUNTER — Encounter: Payer: Self-pay | Admitting: Obstetrics & Gynecology

## 2017-01-15 DIAGNOSIS — Z1231 Encounter for screening mammogram for malignant neoplasm of breast: Secondary | ICD-10-CM | POA: Diagnosis not present

## 2017-01-19 DIAGNOSIS — L820 Inflamed seborrheic keratosis: Secondary | ICD-10-CM | POA: Diagnosis not present

## 2017-01-19 DIAGNOSIS — D045 Carcinoma in situ of skin of trunk: Secondary | ICD-10-CM | POA: Diagnosis not present

## 2017-01-19 DIAGNOSIS — L570 Actinic keratosis: Secondary | ICD-10-CM | POA: Diagnosis not present

## 2017-01-19 DIAGNOSIS — Z85828 Personal history of other malignant neoplasm of skin: Secondary | ICD-10-CM | POA: Diagnosis not present

## 2017-02-26 DIAGNOSIS — D045 Carcinoma in situ of skin of trunk: Secondary | ICD-10-CM | POA: Diagnosis not present

## 2017-08-17 ENCOUNTER — Other Ambulatory Visit: Payer: Self-pay | Admitting: Obstetrics & Gynecology

## 2017-08-17 ENCOUNTER — Telehealth: Payer: Self-pay

## 2017-08-17 DIAGNOSIS — N950 Postmenopausal bleeding: Secondary | ICD-10-CM

## 2017-08-17 NOTE — Telephone Encounter (Signed)
Spoke with patient and informed her. Order placed. Appt desk will call her to schedule.

## 2017-08-17 NOTE — Telephone Encounter (Signed)
Patient said she has not had a period since last July and has just had some unusual bleeding like a light period. Questions if she should come see you before her November annual exam?

## 2017-08-17 NOTE — Telephone Encounter (Signed)
Yes, schedule with me for a Pelvic US.

## 2017-09-08 ENCOUNTER — Other Ambulatory Visit: Payer: Self-pay | Admitting: Obstetrics & Gynecology

## 2017-09-08 DIAGNOSIS — N950 Postmenopausal bleeding: Secondary | ICD-10-CM

## 2017-09-09 ENCOUNTER — Other Ambulatory Visit: Payer: BLUE CROSS/BLUE SHIELD

## 2017-09-09 ENCOUNTER — Ambulatory Visit: Payer: BLUE CROSS/BLUE SHIELD | Admitting: Obstetrics & Gynecology

## 2017-09-09 ENCOUNTER — Ambulatory Visit (INDEPENDENT_AMBULATORY_CARE_PROVIDER_SITE_OTHER): Payer: BLUE CROSS/BLUE SHIELD | Admitting: Obstetrics & Gynecology

## 2017-09-09 ENCOUNTER — Ambulatory Visit (INDEPENDENT_AMBULATORY_CARE_PROVIDER_SITE_OTHER): Payer: BLUE CROSS/BLUE SHIELD

## 2017-09-09 DIAGNOSIS — N950 Postmenopausal bleeding: Secondary | ICD-10-CM | POA: Diagnosis not present

## 2017-09-09 NOTE — Progress Notes (Signed)
    Tasha Anderson Dec 29, 1965 [id number]        52 y.o.  G2P2L2 Widowed.  Boyfriend  RP: Light PMB x 4 days in early July 2019 for Pelvic US  HPI: No pelvic pain.  Not related to IC.  No further postmenopausal bleeding since the episode of early July.   OB History  Gravida Para Term Preterm AB Living  2 2       2   SAB TAB Ectopic Multiple Live Births               # Outcome Date GA Lbr Len/2nd Weight Sex Delivery Anes PTL Lv  2 Para           1 Para             Past medical history,surgical history, problem list, medications, allergies, family history and social history were all reviewed and documented in the EPIC chart.   Directed ROS with pertinent positives and negatives documented in the history of present illness/assessment and plan.  Exam:  There were no vitals filed for this visit. General appearance:  Normal  Pelvic US today: T/V images.  Uterus anteverted homogeneous measuring 7.97 x 5.32 x 3.91 cm.  Endometrial lining is thin and normal at 2.0 mm.  Right and left ovaries normal.  No apparent mass in the right or left adnexa.  No free fluid in the posterior cul-de-sac.   Assessment/Plan:  52 y.o. G2P2   1. Postmenopausal bleeding Very thin and normal endometrial line at 2 mm.  Patient reassured.  No indication to perform an endometrial biopsy given the very thin endometrial line.  Normal uterus with normal bilateral ovaries.  We will follow-up at the time of the annual gynecologic exam in November 2019.  Counseling on above issues and coordination of care more than 50% for 15 minutes.   [name] MD, 3:09 PM 09/09/2017

## 2017-09-10 ENCOUNTER — Encounter: Payer: Self-pay | Admitting: Obstetrics & Gynecology

## 2017-09-10 NOTE — Patient Instructions (Signed)
1. Postmenopausal bleeding Very thin and normal endometrial line at 2 mm.  Patient reassured.  No indication to perform an endometrial biopsy given the very thin endometrial line.  Normal uterus with normal bilateral ovaries.  We will follow-up at the time of the annual gynecologic exam in November 2019.  Izora Gala, good seeing you today!

## 2017-11-15 ENCOUNTER — Encounter: Payer: Self-pay | Admitting: Obstetrics & Gynecology

## 2017-11-15 ENCOUNTER — Ambulatory Visit (INDEPENDENT_AMBULATORY_CARE_PROVIDER_SITE_OTHER): Payer: BLUE CROSS/BLUE SHIELD | Admitting: Obstetrics & Gynecology

## 2017-11-15 VITALS — BP 150/94

## 2017-11-15 DIAGNOSIS — L089 Local infection of the skin and subcutaneous tissue, unspecified: Secondary | ICD-10-CM

## 2017-11-15 DIAGNOSIS — Z113 Encounter for screening for infections with a predominantly sexual mode of transmission: Secondary | ICD-10-CM | POA: Diagnosis not present

## 2017-11-15 DIAGNOSIS — N898 Other specified noninflammatory disorders of vagina: Secondary | ICD-10-CM

## 2017-11-15 DIAGNOSIS — L723 Sebaceous cyst: Secondary | ICD-10-CM

## 2017-11-15 DIAGNOSIS — R300 Dysuria: Secondary | ICD-10-CM

## 2017-11-15 LAB — WET PREP FOR TRICH, YEAST, CLUE

## 2017-11-15 MED ORDER — TINIDAZOLE 500 MG PO TABS: 2.0000 g | ORAL_TABLET | Freq: Every day | ORAL | 0 refills | Status: AC

## 2017-11-15 NOTE — Patient Instructions (Signed)
1. Infected sebaceous cyst Sebaceous gland abscess on the left labia majora, drained with pressure.  Recommendations made to soak in warm water to keep the gland open as it finishes draining.  2. Vaginal odor Bacterial vaginosis.  Diagnosis discussed with patient.  Reassured.  Will treat with tinidazole.  Usage reviewed.  Prescription sent to pharmacy. - WET PREP FOR [place], YEAST, CLUE  3. Dysuria Urine analysis completely negative. - Urinalysis,Complete w/RFL Culture  4. Screen for STD (sexually transmitted disease) - Gono-Chlam - HIV antibody (with reflex) - RPR - Hepatitis C Antibody - Hepatitis B Surface AntiGEN  Other orders - tinidazole (TINDAMAX) 500 MG tablet; Take 4 tablets (2,000 mg total) by mouth daily for 2 days.  Blessed, it was a pleasure seeing you today!  I will inform you of your results as soon as they are available.

## 2017-11-15 NOTE — Progress Notes (Signed)
    Tasha Anderson [birth year] [id number]        52 y.o.  G2P2L2 Widowed.  Boyfriend x 4 months.  2 sons doing well.  Oldest working in [place], youngest in [place].  RP: Left vulvar tender bump with increased vaginal discharge with odor and dysuria  HPI: Left small vulvar tender bump x a few days.  No h/o HSV for herself or boyfriend.  Mild increase in vaginal discharge with odor.  Some burning with urination.  No pelvic pain.  No fever.   OB History  Gravida Para Term Preterm AB Living  2 2       2   SAB TAB Ectopic Multiple Live Births               # Outcome Date GA Lbr Len/2nd Weight Sex Delivery Anes PTL Lv  2 Para           1 Para             Past medical history,surgical history, problem list, medications, allergies, family history and social history were all reviewed and documented in the EPIC chart.   Directed ROS with pertinent positives and negatives documented in the history of present illness/assessment and plan.  Exam:  Vitals:   11/15/17 1432  BP: (!) 150/94   General appearance:  Normal  Abdomen: Normal  Gynecologic exam: Vulva:  Anterior left labia majora sebaceous gland abscess, drained by pressure.  Pus drained.  Speculum:  Cervix/vagina normal.  Mild vaginal discharge.  Gono-Chlam done.  Wet prep done.  Bimanual exam:  Uterus AV, normal volume, mobile, NT.  No adnexal mass, NT.  Wet prep: Clue cells present  U/A Negative   Assessment/Plan:  52 y.o. G2P2   1. Infected sebaceous cyst Sebaceous gland abscess on the left labia majora, drained with pressure.  Recommendations made to soak in warm water to keep the gland open as it finishes draining.  2. Vaginal odor Bacterial vaginosis.  Diagnosis discussed with patient.  Reassured.  Will treat with tinidazole.  Usage reviewed.  Prescription sent to pharmacy. - WET PREP FOR [place], YEAST, CLUE  3. Dysuria Urine analysis completely negative. - Urinalysis,Complete w/RFL Culture  4. Screen for STD  (sexually transmitted disease) - Gono-Chlam - HIV antibody (with reflex) - RPR - Hepatitis C Antibody - Hepatitis B Surface AntiGEN  Other orders - tinidazole (TINDAMAX) 500 MG tablet; Take 4 tablets (2,000 mg total) by mouth daily for 2 days.  Counseling on above issues and coordination of care more than 50% for 25 minutes.  [name] MD, 2:41 PM 11/15/2017

## 2017-11-15 NOTE — Addendum Note (Signed)
Addended by: Thurnell Garbe A on: 11/15/2017 03:57 PM   Modules accepted: Orders

## 2017-11-16 LAB — C. TRACHOMATIS/N. GONORRHOEAE RNA
C. trachomatis RNA, TMA: NOT DETECTED
N. gonorrhoeae RNA, TMA: NOT DETECTED

## 2017-11-16 LAB — URINALYSIS, COMPLETE W/RFL CULTURE
BACTERIA UA: NONE SEEN /HPF
Bilirubin Urine: NEGATIVE
Glucose, UA: NEGATIVE
HYALINE CAST: NONE SEEN /LPF
Hgb urine dipstick: NEGATIVE
KETONES UR: NEGATIVE
Leukocyte Esterase: NEGATIVE
Nitrites, Initial: NEGATIVE
PROTEIN: NEGATIVE
RBC / HPF: NONE SEEN /HPF (ref 0–2)
Specific Gravity, Urine: 1.003 (ref 1.001–1.03)
WBC, UA: NONE SEEN /HPF (ref 0–5)
pH: 5 (ref 5.0–8.0)

## 2017-11-16 LAB — RPR: RPR Ser Ql: NONREACTIVE

## 2017-11-16 LAB — NO CULTURE INDICATED

## 2017-11-16 LAB — HEPATITIS B SURFACE ANTIGEN: Hepatitis B Surface Ag: NONREACTIVE

## 2017-11-16 LAB — HEPATITIS C ANTIBODY
Hepatitis C Ab: NONREACTIVE
SIGNAL TO CUT-OFF: 0.01 (ref <1.00)

## 2017-11-16 LAB — HIV ANTIBODY (ROUTINE TESTING W REFLEX): HIV 1&2 Ab, 4th Generation: NONREACTIVE

## 2017-11-19 ENCOUNTER — Encounter: Payer: Self-pay | Admitting: *Deleted

## 2017-12-16 ENCOUNTER — Encounter: Payer: Self-pay | Admitting: Obstetrics & Gynecology

## 2017-12-16 ENCOUNTER — Ambulatory Visit (INDEPENDENT_AMBULATORY_CARE_PROVIDER_SITE_OTHER): Payer: BLUE CROSS/BLUE SHIELD | Admitting: Obstetrics & Gynecology

## 2017-12-16 VITALS — BP 144/88 | Ht 71.0 in | Wt 179.0 lb

## 2017-12-16 DIAGNOSIS — Z780 Asymptomatic menopausal state: Secondary | ICD-10-CM | POA: Diagnosis not present

## 2017-12-16 DIAGNOSIS — Z23 Encounter for immunization: Secondary | ICD-10-CM | POA: Diagnosis not present

## 2017-12-16 DIAGNOSIS — Z01419 Encounter for gynecological examination (general) (routine) without abnormal findings: Secondary | ICD-10-CM

## 2017-12-16 DIAGNOSIS — Z1151 Encounter for screening for human papillomavirus (HPV): Secondary | ICD-10-CM | POA: Diagnosis not present

## 2017-12-16 NOTE — Patient Instructions (Addendum)
1. Encounter for routine gynecological examination with Papanicolaou smear of cervix Normal gynecologic exam.  Pap/HPV HR done today.  Breast exam normal.  Will schedule screening Mammogram 01/2018.  Good body mass index at 24.97.  Fit and healthy nutrition.  Health labs with family physician.  2. Menopause present HRT discussed thoroughly including usage, options, risks and benefits.  In particular the increased risk of breast cancer after 10 years on hormone replacement therapy and the risk of strokes increasing with age.  The benefits of cardiovascular protection, decrease rate of bone loss control of vasomotor symptoms, the benefits on the skin, including the genital area for sexual activity, sleep, memory, mood.  Will call back if wants to start.  Vit D supplement, Ca++ intake of 1.5 g/day, regular weight bearing physical activity recommended.  3. Special screening examination for human papillomavirus (HPV) Pap/HPV HR done  4. Flu vaccine need Flu shot given   Tasha Anderson, it was a pleasure seeing you today!  I will inform you of your results as soon as they are available.     Menopause and Hormone Replacement Therapy What is hormone replacement therapy? Hormone replacement therapy (HRT) is the use of artificial (synthetic) hormones to replace hormones that your body stops producing during menopause. Menopause is the normal time of life when menstrual periods stop completely and the ovaries stop producing the female hormones estrogen and progesterone. This lack of hormones can affect your health and cause undesirable symptoms. HRT can relieve some of those symptoms. What are my options for HRT? HRT may consist of the synthetic hormones estrogen and progestin, or it may consist of only estrogen (estrogen-only therapy). You and your health care provider will decide which form of HRT is best for you. If you choose to be on HRT and you have a uterus, estrogen and progestin are usually prescribed.  Estrogen-only therapy is used for women who do not have a uterus. Possible options for taking HRT include:  Pills.  Patches.  Gels.  Sprays.  Vaginal cream.  Vaginal rings.  Vaginal inserts.  The amount of hormone(s) that you take and how long you take the hormone(s) varies depending on your individual health. It is important to:  Begin HRT with the lowest possible dosage.  Stop HRT as soon as your health care provider tells you to stop.  Work with your health care provider so that you feel informed and comfortable with your decisions.  What are the benefits of HRT? HRT can reduce the frequency and severity of menopausal symptoms. Benefits of HRT vary depending on the menopausal symptoms that you have, the severity of your symptoms, and your overall health. HRT may help to improve the following menopausal symptoms:  Hot flashes and night sweats. These are sudden feelings of heat that spread over the face and body. The skin may turn red, like a blush. Night sweats are hot flashes that happen while you are sleeping or trying to sleep.  Bone loss (osteoporosis). The body loses calcium more quickly after menopause, causing the bones to become weaker. This can increase the risk for bone breaks (fractures).  Vaginal dryness. The lining of the vagina can become thin and dry, which can cause pain during sexual intercourse or cause infection, burning, or itching.  Urinary tract infections.  Urinary incontinence. This is a decreased ability to control when you urinate.  Irritability.  Short-term memory problems.  What are the risks of HRT? Risks of HRT vary depending on your individual health and medical  history. Risks of HRT also depend on whether you receive both estrogen and progestin or you receive estrogen only.HRT may increase the risk of:  Spotting. This is when a small amount of bloodleaks from the vagina unexpectedly.  Endometrial cancer. This cancer is in the lining  of the uterus (endometrium).  Breast cancer.  Increased density of breast tissue. This can make it harder to find breast cancer on a breast X-ray (mammogram).  Stroke.  Heart attack.  Blood clots.  Gallbladder disease.  Risks of HRT can increase if you have any of the following conditions:  Endometrial cancer.  Liver disease.  Heart disease.  Breast cancer.  History of blood clots.  History of stroke.  How should I care for myself while I am on HRT?  Take over-the-counter and prescription medicines only as told by your health care provider.  Get mammograms, pelvic exams, and medical checkups as often as told by your health care provider.  Have Pap tests done as often as told by your health care provider. A Pap test is sometimes called a Pap smear. It is a screening test that is used to check for signs of cancer of the cervix and vagina. A Pap test can also identify the presence of infection or precancerous changes. Pap tests may be done: ? Every 3 years, starting at age 41. ? Every 5 years, starting after age 84, in combination with testing for human papillomavirus (HPV). ? More often or less often depending on other medical conditions you have, your age, and other risk factors.  It is your responsibility to get your Pap test results. Ask your health care provider or the department performing the test when your results will be ready.  Keep all follow-up visits as told by your health care provider. This is important. When should I seek medical care? Talk with your health care provider if:  You have any of these: ? Pain or swelling in your legs. ? Shortness of breath. ? Chest pain. ? Lumps or changes in your breasts or armpits. ? Slurred speech. ? Pain, burning, or bleeding when you urine.  You develop any of these: ? Unusual vaginal bleeding. ? Dizziness or headaches. ? Weakness or numbness in any part of your arms or legs. ? Pain in your abdomen.  This  information is not intended to replace advice given to you by your health care provider. Make sure you discuss any questions you have with your health care provider. Document Released: 10/25/2002 Document Revised: 12/24/2015 Document Reviewed: 07/30/2014 Elsevier Interactive Patient Education  2017 [employer].

## 2017-12-16 NOTE — Progress Notes (Signed)
Tasha Anderson Feb 18, 1965 [id number]   History:    52 y.o. G2P2L2 Widowed.  Boyfriend 5 months  RP:  Established patient presenting for annual gyn exam   HPI: Menopause, mildly symptomatic on no HRT.  Occasional night sweats.  No dryness with IC.  Full STI work-up negative 11/2017.  Breasts wnl.  Urine/BMs wnl.  BMI 24.97.  Fit and healthy nutrition.  Fam h/o Osteoporosis.  Past medical history,surgical history, family history and social history were all reviewed and documented in the EPIC chart.  Gynecologic History Patient's last menstrual period was 08/09/2016. Contraception: post menopausal status Last Pap: 12/2016. Results were: Negative Last mammogram: 01/2017. Results were: Negative Bone Density: Never Colonoscopy: 2018  Obstetric History OB History  Gravida Para Term Preterm AB Living  2 2       2   SAB TAB Ectopic Multiple Live Births               # Outcome Date GA Lbr Len/2nd Weight Sex Delivery Anes PTL Lv  2 Para           1 Para              ROS: A ROS was performed and pertinent positives and negatives are included in the history.  GENERAL: No fevers or chills. HEENT: No change in vision, no earache, sore throat or sinus congestion. NECK: No pain or stiffness. CARDIOVASCULAR: No chest pain or pressure. No palpitations. PULMONARY: No shortness of breath, cough or wheeze. GASTROINTESTINAL: No abdominal pain, nausea, vomiting or diarrhea, melena or bright red blood per rectum. GENITOURINARY: No urinary frequency, urgency, hesitancy or dysuria. MUSCULOSKELETAL: No joint or muscle pain, no back pain, no recent trauma. DERMATOLOGIC: No rash, no itching, no lesions. ENDOCRINE: No polyuria, polydipsia, no heat or cold intolerance. No recent change in weight. HEMATOLOGICAL: No anemia or easy bruising or bleeding. NEUROLOGIC: No headache, seizures, numbness, tingling or weakness. PSYCHIATRIC: No depression, no loss of interest in normal activity or change in sleep pattern.       Exam:   BP (!) 144/88   Ht 5\' 11"  (1.803 m)   Wt 179 lb (81.2 kg)   LMP 08/09/2016   BMI 24.97 kg/m   Body mass index is 24.97 kg/m.  General appearance : Well developed well nourished female. No acute distress HEENT: Eyes: no retinal hemorrhage or exudates,  Neck supple, trachea midline, no carotid bruits, no thyroidmegaly Lungs: Clear to auscultation, no rhonchi or wheezes, or rib retractions  Heart: Regular rate and rhythm, no murmurs or gallops Breast:Examined in sitting and supine position were symmetrical in appearance, no palpable masses or tenderness,  no skin retraction, no nipple inversion, no nipple discharge, no skin discoloration, no axillary or supraclavicular lymphadenopathy Abdomen: no palpable masses or tenderness, no rebound or guarding Extremities: no edema or skin discoloration or tenderness  Pelvic: Vulva: Normal             Vagina: No gross lesions or discharge  Cervix: No gross lesions or discharge.  Pap/HPV HR done.  Uterus  AV, normal size, shape and consistency, non-tender and mobile  Adnexa  Without masses or tenderness  Anus: Normal   Assessment/Plan:  52 y.o. female for annual exam   1. Encounter for routine gynecological examination with Papanicolaou smear of cervix Normal gynecologic exam.  Pap/HPV HR done today.  Breast exam normal.  Will schedule screening Mammogram 01/2018.  Good body mass index at 24.97.  Fit and healthy nutrition.  Health  labs with family physician.  2. Menopause present HRT discussed thoroughly including usage, options, risks and benefits.  In particular the increased risk of breast cancer after 10 years on hormone replacement therapy and the risk of strokes increasing with age.  The benefits of cardiovascular protection, decrease rate of bone loss control of vasomotor symptoms, the benefits on the skin, including the genital area for sexual activity, sleep, memory, mood.  Will call back if wants to start.  Vit D  supplement, Ca++ intake of 1.5 g/day, regular weight bearing physical activity recommended.  3. Special screening examination for human papillomavirus (HPV) Pap/HPV HR done  4. Flu vaccine need Flu shot given  [name] MD, 11:59 AM 12/16/2017

## 2017-12-20 LAB — PAP, TP IMAGING W/ HPV RNA, RFLX HPV TYPE 16,18/45: HPV DNA HIGH RISK: DETECTED — AB

## 2018-02-18 ENCOUNTER — Ambulatory Visit: Payer: BLUE CROSS/BLUE SHIELD | Admitting: Obstetrics & Gynecology

## 2018-02-25 ENCOUNTER — Encounter: Payer: Self-pay | Admitting: Obstetrics & Gynecology

## 2018-02-25 ENCOUNTER — Ambulatory Visit (INDEPENDENT_AMBULATORY_CARE_PROVIDER_SITE_OTHER): Payer: BLUE CROSS/BLUE SHIELD | Admitting: Obstetrics & Gynecology

## 2018-02-25 VITALS — BP 132/86

## 2018-02-25 DIAGNOSIS — N871 Moderate cervical dysplasia: Secondary | ICD-10-CM | POA: Diagnosis not present

## 2018-02-25 DIAGNOSIS — R87612 Low grade squamous intraepithelial lesion on cytologic smear of cervix (LGSIL): Secondary | ICD-10-CM

## 2018-02-25 DIAGNOSIS — R87810 Cervical high risk human papillomavirus (HPV) DNA test positive: Secondary | ICD-10-CM

## 2018-02-25 NOTE — Addendum Note (Signed)
Addended by: Thurnell Garbe A on: 02/25/2018 10:37 AM   Modules accepted: Orders

## 2018-02-25 NOTE — Progress Notes (Signed)
    Tasha Anderson 1965/09/09 [id number]        53 y.o.  G2P2L2 Divorced.  Boyfriend x 7 months  RP: LGSIL/HPV HR positive  HPI: Pap test 2016 negative, HPV HR neg.  Pap 12/2016 Negative.  Pap 12/2017 LGSIL, HPV HR positive.  Full STI screen with Gono-Chlam negative in 11/2017.  No abnormal bleeding.  No pelvic pain.  No vaginal discharge.   OB History  Gravida Para Term Preterm AB Living  2 2       2   SAB TAB Ectopic Multiple Live Births               # Outcome Date GA Lbr Len/2nd Weight Sex Delivery Anes PTL Lv  2 Para           1 Para             Past medical history,surgical history, problem list, medications, allergies, family history and social history were all reviewed and documented in the EPIC chart.   Directed ROS with pertinent positives and negatives documented in the history of present illness/assessment and plan.  Exam:  Vitals:   02/25/18 0914  BP: 132/86   General appearance:  Normal  Colposcopy Procedure Note Tasha Anderson 02/25/2018  Indications: LGSIL/HPV HR positive  Procedure Details  The risks and benefits of the procedure and Verbal informed consent obtained.  Speculum placed in vagina and excellent visualization of cervix achieved, cervix swabbed x 3 with acetic acid solution.  Findings:  Cervix colposcopy: Physical Exam Genitourinary:       Vaginal colposcopy: Normal  Vulvar colposcopy: Grossly normal  Perirectal colposcopy: Grossly normal  The cervix was sprayed with Hurricane before performing the cervical biopsies.  Specimens: HPV 16-18-45.  Cervical Biopsies at 3, 6 and 9 O'clock.  Complications: None, hemostasis with Silver Nitrate and Moncel . Plan:  Management per results of HPV 16-18-45 and Cervical Bxs   Assessment/Plan:  53 y.o. G2P2   1. 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  Transmission of HPV, natural course and risks reviewed with patient.   Colposcopy procedure explained.  Colposcopy findings reviewed.  Management per results.  2. 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  Counseling on above issues and coordination of care more than 50% for 15 minutes.  Tasha Bruins MD, 9:23 AM 02/25/2018

## 2018-02-25 NOTE — Patient Instructions (Signed)
1. 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  Transmission of HPV, natural course and risks reviewed with patient.  Colposcopy procedure explained.  Colposcopy findings reviewed.  Management per results.  Tasha Anderson, it was a pleasure seeing you today!  I will inform you of your results as soon as they are available.

## 2018-02-28 LAB — HPV TYPE 16 AND 18/45 RNA
HPV TYPE 16 RNA: NOT DETECTED
HPV TYPE 18/45 RNA: NOT DETECTED

## 2018-03-01 LAB — PATHOLOGY

## 2018-03-01 LAB — TISSUE PATH REPORT 10802

## 2018-03-04 ENCOUNTER — Encounter: Payer: Self-pay | Admitting: Obstetrics & Gynecology

## 2018-04-07 ENCOUNTER — Encounter: Payer: Self-pay | Admitting: Obstetrics & Gynecology

## 2018-04-07 ENCOUNTER — Ambulatory Visit (INDEPENDENT_AMBULATORY_CARE_PROVIDER_SITE_OTHER): Payer: BLUE CROSS/BLUE SHIELD | Admitting: Obstetrics & Gynecology

## 2018-04-07 DIAGNOSIS — N871 Moderate cervical dysplasia: Secondary | ICD-10-CM

## 2018-04-07 DIAGNOSIS — R87810 Cervical high risk human papillomavirus (HPV) DNA test positive: Secondary | ICD-10-CM

## 2018-04-07 NOTE — Progress Notes (Signed)
Tasha Anderson 09/18/1965 [id number]        53 y.o.  G2P2L2 Widowed/Boyfriend x 07/2017  RP: CIN 2 for LEEP procedure  HPI: Pap 12/16/2017 LGSIL/HPV HR positive.  Colpo 02/25/2018 CIN 2 at 6 O'clock.  HPV 16-18-45 negative.   OB History  Gravida Para Term Preterm AB Living  2 2       2   SAB TAB Ectopic Multiple Live Births               # Outcome Date GA Lbr Len/2nd Weight Sex Delivery Anes PTL Lv  2 Para           1 Para             Past medical history,surgical history, problem list, medications, allergies, family history and social history were all reviewed and documented in the EPIC chart.   Directed ROS with pertinent positives and negatives documented in the history of present illness/assessment and plan.  Exam:  Vitals:   04/07/18 1123  BP: 140/80   General appearance:  Normal  LEEP (Leep electrosurgical excision procedure)    Patient Name:Tasha Anderson  Record [id number]  Indication For Surgery: CIN 2/HPV HR positive  Surgeon: [name]  Anesthesia: Paracervical Block Xylo 2% 10 cc   Procedure:  LEEP (Loop electrosurgical excision procedure) Description of Operation:  After the patient was verbally counseled the patient was placed in the low lithotomy position.  A coated speculum was inserted into the vagina and colposcopic examination was performed with 4% acidic acid showing AW/punctation most intense at 6 O'clock.  Approximately 10 cc's of 2% xylocaine was infiltrated at 4 and 8 O'clock for a paracervical block.  The [facility] Electrosurgical Generator was then turned on after the patient was grounded with pad electrode on her thigh and jewelry removed.  The settings on the generator were Blend 1 current 67 watts cut and 67 watts on the coagulation mode.  A white loop electrode was utilized to exercise the atypical transformation zone.  The tip of the electrode was placed 3 mm from the edge of the lesion at 6 o'clock position.   The electrode was moved slowly over the lesion was within the loop limits.  A vaginal wall retractor was not used.  The loop was then repositioned and the finger switch on the hand held piece was activated.  A slight pressure on the shaft was applied and the loop was extended into the tissue up to its crossbar, then with steady, slow motion across and underneath the endocervical button was excised. An extra loop was taken from 3-9 O'clock and a small piece was detached at 8 O'clock.  The loop electrode was replaced with ball electrode set at 50 watts and the base of the crater was fulgurated circumferentially.  Monsell's paint was then applied for additional hemostasis. The cervical specimens were marked with pins for orientation, and placed in formation fixative for pathology evaluation.  Patient tolerated the procedure well with minimal blood loss and without any complications.  After the procedure patient left office with stable vital signs and instructions sheet.   Assessment/Plan:  53 y.o. G2P2   1. 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  HPV 16-18 and 45 negative.  Colposcopy and LEEP procedure done today under paracervical block.  Well-tolerated without complications.  Bleeding precautions discussed with patient.  Pending pathology.  Further management per results.  2. Cervical high risk HPV (  human papillomavirus) test positive As above.  [name] MD, 11:41 AM 04/07/2018

## 2018-04-07 NOTE — Patient Instructions (Signed)
1. 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  HPV 16-18 and 45 negative.  Colposcopy and LEEP procedure done today under paracervical block.  Well-tolerated without complications.  Bleeding precautions discussed with patient.  Pending pathology.  Further management per results.  2. Cervical high risk HPV (human papillomavirus) test positive As above.  LEEP POST-PROCEDURE INSTRUCTIONS  1. You may take Ibuprofen, Aleve or Tylenol for pain if needed.  Cramping is normal.  2. You will have black and/or bloody discharge at first.  This will lighten and then turn clear before completely resolving.  This will take 2 to 3 weeks.  3. Put nothing in your vagina until the bleeding or discharge stops (usually 2 or3 days).  4. You need to call if you have redness around the biopsy site, if there is any unusual draining, if the bleeding is heavy, or if you are concerned.  5. Shower or bathe as normal  6. We will call you within one week with results or we will discuss the results at your follow-up appointment if needed.  7. You will need to return for a follow-up Pap smear as directed by your physician.   Tasha Anderson, good seeing you today!  I will inform you of your results as soon as they are available.

## 2018-04-18 ENCOUNTER — Telehealth: Payer: Self-pay | Admitting: *Deleted

## 2018-04-18 NOTE — Telephone Encounter (Signed)
Patient had LEEP done on 04/07/18 had spotting only, then over this past weekend noticed heavier flow, something hard came out (not sure what it was)  Now dark brown flow, not heavy, no pain, slight vaginal odor as well. Patient just wanted to confirm this is normal? Also asked how long can she expect the bleeding? Please advise

## 2018-04-18 NOTE — Telephone Encounter (Signed)
Left detailed message on cell per DPR access. 

## 2018-04-18 NOTE — Telephone Encounter (Signed)
Yes normal.  Brown spotting could last 2  Weeks, hopefully 1 week.

## 2018-10-27 ENCOUNTER — Other Ambulatory Visit: Payer: Self-pay

## 2018-10-27 ENCOUNTER — Ambulatory Visit (INDEPENDENT_AMBULATORY_CARE_PROVIDER_SITE_OTHER): Payer: BC Managed Care – PPO | Admitting: Obstetrics & Gynecology

## 2018-10-27 ENCOUNTER — Encounter: Payer: Self-pay | Admitting: Obstetrics & Gynecology

## 2018-10-27 VITALS — BP 128/82

## 2018-10-27 DIAGNOSIS — R87810 Cervical high risk human papillomavirus (HPV) DNA test positive: Secondary | ICD-10-CM | POA: Diagnosis not present

## 2018-10-27 DIAGNOSIS — Z9889 Other specified postprocedural states: Secondary | ICD-10-CM

## 2018-10-27 DIAGNOSIS — N871 Moderate cervical dysplasia: Secondary | ICD-10-CM | POA: Diagnosis not present

## 2018-10-27 NOTE — Progress Notes (Signed)
    Tasha Anderson January 29, 1966 [id number]        53 y.o.  G2P2L2 Widowed.    RP: Repeat Pap 6 month post LEEP  HPI: LEEP 03/2018 HGSIL (CIN 2) with margins negative.    OB History  Gravida Para Term Preterm AB Living  2 2       2   SAB TAB Ectopic Multiple Live Births               # Outcome Date GA Lbr Len/2nd Weight Sex Delivery Anes PTL Lv  2 Para           1 Para             Past medical history,surgical history, problem list, medications, allergies, family history and social history were all reviewed and documented in the EPIC chart.   Directed ROS with pertinent positives and negatives documented in the history of present illness/assessment and plan.  Exam:  Vitals:   10/27/18 1524  BP: 128/82   General appearance:  Normal  Gynecologic exam: Vulva normal.  Speculum:  Cervix normal.  Pap/HPV HR done.  Normal secretions.  LEEP 04/07/2018:   Diagnosis  1. Cervix, LEEP, 6 o'clock  - HIGH GRADE SQUAMOUS INTRAEPITHELIAL (CIN-II, MODERATE SQUAMOUS DYSPLASIA) IN CERVICAL  TRANSFORMATION ZONE MUCOSA. NO INVASIVE PROCESS IDENTIFIED. SURGICAL RESECTION MARGIN APPEARS  CLEAR.  2. Cervix, LEEP, upper 3 o'clock - 9 o'clock  - BENIGN CERVIX. NO ATYPIA, DYSPLASIA, OR MALIGNANCY IDENTIFIED.  3. Cervix, LEEP, 8 o'clock  - BENIGN STROMAL TISSUE. NO ATYPIA, DYSPLASIA, OR MALIGNANCY IDENTIFIED.  - SMALL FRAGMENTS OF DETACHED BENIGN SQUAMOUS EPITHELIUM.         Assessment/Plan:  53 y.o. G2P2   1. Dysplasia of cervix, high grade CIN 2 Post LEEP in February 2020.  CIN-2, margins negative.  Pap test with high-risk HPV done today.  Management per results.  2. H/O LEEP As above.  3. 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  High-risk HPV done today.  Counseling on above issues and coordination of care more than 50% for 15 minutes.  Tasha Bruins MD, 3:44 PM 10/27/2018

## 2018-10-28 ENCOUNTER — Encounter: Payer: Self-pay | Admitting: Obstetrics & Gynecology

## 2018-10-28 NOTE — Patient Instructions (Signed)
1. Dysplasia of cervix, high grade CIN 2 Post LEEP in February 2020.  CIN-2, margins negative.  Pap test with high-risk HPV done today.  Management per results.  2. H/O LEEP As above.  3. 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  High-risk HPV done today.  Tasha Anderson, it was a pleasure seeing you today!  I will inform you of your results as soon as they are available.

## 2018-10-31 LAB — PAP, TP IMAGING W/ HPV RNA, RFLX HPV TYPE 16,18/45: HPV DNA High Risk: DETECTED — AB

## 2018-11-16 ENCOUNTER — Other Ambulatory Visit: Payer: Self-pay

## 2018-11-17 ENCOUNTER — Other Ambulatory Visit: Payer: Self-pay | Admitting: Obstetrics & Gynecology

## 2018-11-17 ENCOUNTER — Encounter: Payer: Self-pay | Admitting: Obstetrics & Gynecology

## 2018-11-17 ENCOUNTER — Ambulatory Visit (INDEPENDENT_AMBULATORY_CARE_PROVIDER_SITE_OTHER): Payer: BC Managed Care – PPO | Admitting: Obstetrics & Gynecology

## 2018-11-17 VITALS — BP 128/80

## 2018-11-17 DIAGNOSIS — R87610 Atypical squamous cells of undetermined significance on cytologic smear of cervix (ASC-US): Secondary | ICD-10-CM

## 2018-11-17 DIAGNOSIS — N870 Mild cervical dysplasia: Secondary | ICD-10-CM | POA: Diagnosis not present

## 2018-11-17 DIAGNOSIS — R87810 Cervical high risk human papillomavirus (HPV) DNA test positive: Secondary | ICD-10-CM

## 2018-11-17 DIAGNOSIS — Z9889 Other specified postprocedural states: Secondary | ICD-10-CM

## 2018-11-17 NOTE — Progress Notes (Signed)
    Tasha Anderson Apr 23, 1965 [id number]        53 y.o.  G2P2L2 Widowed.    RP: ASCUS/HPV HR positive  HPI: Pap 10/2018 ASCUS/HPV HR pos.  H/O LEEP 03/2018 HGSIL/CIN 2, Margins negative.  HPV 16-18-45 previously negative.    OB History  Gravida Para Term Preterm AB Living  2 2       2   SAB TAB Ectopic Multiple Live Births               # Outcome Date GA Lbr Len/2nd Weight Sex Delivery Anes PTL Lv  2 Para           1 Para             Past medical history,surgical history, problem list, medications, allergies, family history and social history were all reviewed and documented in the EPIC chart.   Directed ROS with pertinent positives and negatives documented in the history of present illness/assessment and plan.  Exam:  Vitals:   11/17/18 1507  BP: 128/80   General appearance:  Normal  Colposcopy Procedure Note Tasha Anderson 11/17/2018  Indications:  Procedure Details  The risks and benefits of the procedure and Verbal informed consent obtained.  Speculum placed in vagina and excellent visualization of cervix achieved, cervix swabbed x 3 with acetic acid solution.  Findings:  Cervix colposcopy: Physical Exam Genitourinary:       Vaginal colposcopy: Normal  Vulvar colposcopy: Normal  Perirectal colposcopy: Grossly normal  The cervix was sprayed with Hurricane before performing the cervical biopsies.  Specimens:  Cervical Bx at 11 O'Clock  Complications:  None, good hemostasis with Silver Nitrate . Plan:  Management per results   Assessment/Plan:  53 y.o. G2P2   1. ASCUS with positive high risk HPV cervical ASCUS/HPV HR pos.  H/O LEEP 03/2018 HGSIL/CIN 2 with Margins negative.  HPV 16-18-45 negative.  HPV High Risk infection discussed.  Colposcopy findings reviewed with patient.  Procedure well tolerated, no Cx.  Management per Cervical Bx results.  Post Colpo precautions.  2. H/O LEEP H/O LEEP HGSIL/CIN 2 with Margins negative in 03/2018.   [name] MD, 3:26 PM 11/17/2018

## 2018-11-17 NOTE — Patient Instructions (Signed)
1. ASCUS with positive high risk HPV cervical ASCUS/HPV HR pos.  H/O LEEP 03/2018 HGSIL/CIN 2 with Margins negative.  HPV 16-18-45 negative.  HPV High Risk infection discussed.  Colposcopy findings reviewed with patient.  Procedure well tolerated, no Cx.  Management per Cervical Bx results.  Post Colpo precautions.  2. H/O LEEP H/O LEEP HGSIL/CIN 2 with Margins negative in 03/2018.  Tasha Anderson, it was a pleasure seeing you today!  I will inform you of your results as soon as they are available.

## 2018-12-22 ENCOUNTER — Encounter: Payer: BLUE CROSS/BLUE SHIELD | Admitting: Obstetrics & Gynecology

## 2019-02-15 ENCOUNTER — Encounter: Payer: BC Managed Care – PPO | Admitting: Obstetrics & Gynecology

## 2019-04-10 ENCOUNTER — Encounter: Payer: Self-pay | Admitting: Obstetrics & Gynecology

## 2019-04-11 ENCOUNTER — Other Ambulatory Visit: Payer: Self-pay

## 2019-04-12 ENCOUNTER — Encounter: Payer: Self-pay | Admitting: Obstetrics & Gynecology

## 2019-04-12 ENCOUNTER — Ambulatory Visit (INDEPENDENT_AMBULATORY_CARE_PROVIDER_SITE_OTHER): Payer: BC Managed Care – PPO | Admitting: Obstetrics & Gynecology

## 2019-04-12 VITALS — BP 122/80 | Ht 70.5 in | Wt 189.0 lb

## 2019-04-12 DIAGNOSIS — Z780 Asymptomatic menopausal state: Secondary | ICD-10-CM | POA: Diagnosis not present

## 2019-04-12 DIAGNOSIS — Z9889 Other specified postprocedural states: Secondary | ICD-10-CM

## 2019-04-12 DIAGNOSIS — Z01419 Encounter for gynecological examination (general) (routine) without abnormal findings: Secondary | ICD-10-CM

## 2019-04-12 DIAGNOSIS — R87810 Cervical high risk human papillomavirus (HPV) DNA test positive: Secondary | ICD-10-CM

## 2019-04-12 NOTE — Patient Instructions (Signed)
1. Encounter for routine gynecological examination with Papanicolaou smear of cervix Normal gynecologic exam in menopause.  Pap/HPV HR done.  Breast exam normal.  Screening Mammo results pending.  Colono 2018.  Health labs Fam MD.  BMI 26.74.  Continue with fitness.  Recommend a lower Calorie/Carb diet, such as [employer].   2. Cervical high risk HPV (human papillomavirus) test positive HPV 16-18-45 Negative.  Last Colpo 11/2018 CIN 1.  3. H/O LEEP  4. Postmenopause Well on no HRT.  No PMB.  Recommend Vit D supplements, Ca++ total 1200 mg/day including food, regular weight-bearing physical activities to continue.  Other orders - PAP,TP IMGw/HPV RNA,rflx [id number]  Tasha Anderson, it was a pleasure seeing you today!  I will inform you of your results as soon as they are available.

## 2019-04-12 NOTE — Progress Notes (Signed)
Tasha Anderson August 05, 1965 [id number]   History:    54 y.o. G2P2L2 Widowed.  Engaged.  RP:  Established patient presenting for annual gyn exam   HPI: Menopause, well on no HRT. No PMB.  No pelvic pain.  No dryness with IC.  H/O CIN2, had a LEEP.  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  HPV 16-18-45 Negative.  Full STI work-up negative 11/2017.  Breasts wnl.  Urine/BMs wnl.  BMI 26.74.  Fit and healthy nutrition.  Fam h/o Osteoporosis.  Health Labs with Fam MD.   Past medical history,surgical history, family history and social history were all reviewed and documented in the EPIC chart.  Gynecologic History Patient's last menstrual period was 08/09/2016.  Obstetric History OB History  Gravida Para Term Preterm AB Living  2 2       2   SAB TAB Ectopic Multiple Live Births               # Outcome Date GA Lbr Len/2nd Weight Sex Delivery Anes PTL Lv  2 Para           1 Para              ROS: A ROS was performed and pertinent positives and negatives are included in the history.  GENERAL: No fevers or chills. HEENT: No change in vision, no earache, sore throat or sinus congestion. NECK: No pain or stiffness. CARDIOVASCULAR: No chest pain or pressure. No palpitations. PULMONARY: No shortness of breath, cough or wheeze. GASTROINTESTINAL: No abdominal pain, nausea, vomiting or diarrhea, melena or bright red blood per rectum. GENITOURINARY: No urinary frequency, urgency, hesitancy or dysuria. MUSCULOSKELETAL: No joint or muscle pain, no back pain, no recent trauma. DERMATOLOGIC: No rash, no itching, no lesions. ENDOCRINE: No polyuria, polydipsia, no heat or cold intolerance. No recent change in weight. HEMATOLOGICAL: No anemia or easy bruising or bleeding. NEUROLOGIC: No headache, seizures, numbness, tingling or weakness. PSYCHIATRIC: No depression, no loss of interest in normal activity or change in sleep pattern.     Exam:   BP 122/80   Ht 5' 10.5" (1.791 m)   Wt 189 lb (85.7 kg)   LMP 08/09/2016    BMI 26.74 kg/m   Body mass index is 26.74 kg/m.  General appearance : Well developed well nourished female. No acute distress HEENT: Eyes: no retinal hemorrhage or exudates,  Neck supple, trachea midline, no carotid bruits, no thyroidmegaly Lungs: Clear to auscultation, no rhonchi or wheezes, or rib retractions  Heart: Regular rate and rhythm, no murmurs or gallops Breast:Examined in sitting and supine position were symmetrical in appearance, no palpable masses or tenderness,  no skin retraction, no nipple inversion, no nipple discharge, no skin discoloration, no axillary or supraclavicular lymphadenopathy Abdomen: no palpable masses or tenderness, no rebound or guarding Extremities: no edema or skin discoloration or tenderness  Pelvic: Vulva: Normal             Vagina: No gross lesions or discharge  Cervix: No gross lesions or discharge.  Pap/HPV HR done.  Uterus  AV, normal size, shape and consistency, non-tender and mobile  Adnexa  Without masses or tenderness  Anus: Normal   Assessment/Plan:  54 y.o. female for annual exam   1. Encounter for routine gynecological examination with Papanicolaou smear of cervix Normal gynecologic exam in menopause.  Pap/HPV HR done.  Breast exam normal.  Screening Mammo results pending.  Colono 2018.  Health labs Fam MD.  BMI 26.74.  Continue with fitness.  Recommend a lower Calorie/Carb diet, such as [employer].   2. Cervical high risk HPV (human papillomavirus) test positive HPV 16-18-45 Negative.  Last Colpo 11/2018 CIN 1.  3. H/O LEEP  4. Postmenopause Well on no HRT.  No PMB.  Recommend Vit D supplements, Ca++ total 1200 mg/day including food, regular weight-bearing physical activities to continue.  Other orders - PAP,TP IMGw/HPV RNA,rflx [id number]  [name] MD, 5:04 PM 04/12/2019

## 2019-04-14 LAB — PAP, TP IMAGING W/ HPV RNA, RFLX HPV TYPE 16,18/45: HPV DNA High Risk: NOT DETECTED

## 2019-08-16 DIAGNOSIS — H6981 Other specified disorders of Eustachian tube, right ear: Secondary | ICD-10-CM | POA: Diagnosis not present

## 2019-10-06 DIAGNOSIS — M7062 Trochanteric bursitis, left hip: Secondary | ICD-10-CM | POA: Diagnosis not present

## 2019-10-20 ENCOUNTER — Encounter: Payer: Self-pay | Admitting: Obstetrics & Gynecology

## 2019-10-20 ENCOUNTER — Other Ambulatory Visit: Payer: Self-pay

## 2019-10-20 ENCOUNTER — Ambulatory Visit (INDEPENDENT_AMBULATORY_CARE_PROVIDER_SITE_OTHER): Payer: BC Managed Care – PPO | Admitting: Obstetrics & Gynecology

## 2019-10-20 VITALS — BP 132/86

## 2019-10-20 DIAGNOSIS — R87610 Atypical squamous cells of undetermined significance on cytologic smear of cervix (ASC-US): Secondary | ICD-10-CM | POA: Diagnosis not present

## 2019-10-20 DIAGNOSIS — N870 Mild cervical dysplasia: Secondary | ICD-10-CM | POA: Diagnosis not present

## 2019-10-20 DIAGNOSIS — R87810 Cervical high risk human papillomavirus (HPV) DNA test positive: Secondary | ICD-10-CM | POA: Diagnosis not present

## 2019-10-20 NOTE — Progress Notes (Signed)
    DELOMA SPINDLE May 03, 1965 [id number]        54 y.o.  G2P2L2   RP: Repeat Pap test  HPI: H/O LEEP.  Last Colpo CIN 1 in 11/2018.  Pap test March 2021 showed ASCUS with high risk HPV positive.  HPV 16-18-45 negative in October 2020.   OB History  Gravida Para Term Preterm AB Living  2 2       2   SAB TAB Ectopic Multiple Live Births               # Outcome Date GA Lbr Len/2nd Weight Sex Delivery Anes PTL Lv  2 Para           1 Para             Past medical history,surgical history, problem list, medications, allergies, family history and social history were all reviewed and documented in the EPIC chart.   Directed ROS with pertinent positives and negatives documented in the history of present illness/assessment and plan.  Exam:  Vitals:   10/20/19 1002  BP: 132/86   General appearance:  Normal  Abdomen: Normal  Gynecologic exam: Vulva normal.  Speculum:  Cervix/Vagina normal.  Pap reflex done.   Assessment/Plan:  54 y.o. G2P2   1. Dysplasia of cervix, low grade (CIN 1) Pap test March 2021 showed ASCUS with high risk HPV positive.  Colposcopy October 2020 showed CIN-1.  Earlier history of LEEP for CIN-2.  2. Cervical high risk HPV (human papillomavirus) test positive High risk HPV positive March 2021.  HPV 16-18-45 negative October 2020.  [name] MD, 10:25 AM 10/20/2019

## 2019-10-26 LAB — PAP IG W/ RFLX HPV ASCU

## 2019-10-26 LAB — HUMAN PAPILLOMAVIRUS, HIGH RISK: HPV DNA High Risk: NOT DETECTED

## 2019-12-01 DIAGNOSIS — D225 Melanocytic nevi of trunk: Secondary | ICD-10-CM | POA: Diagnosis not present

## 2019-12-01 DIAGNOSIS — Z85828 Personal history of other malignant neoplasm of skin: Secondary | ICD-10-CM | POA: Diagnosis not present

## 2019-12-01 DIAGNOSIS — L570 Actinic keratosis: Secondary | ICD-10-CM | POA: Diagnosis not present

## 2019-12-01 DIAGNOSIS — L821 Other seborrheic keratosis: Secondary | ICD-10-CM | POA: Diagnosis not present

## 2020-01-03 DIAGNOSIS — I10 Essential (primary) hypertension: Secondary | ICD-10-CM | POA: Diagnosis not present

## 2020-01-03 DIAGNOSIS — Z13220 Encounter for screening for lipoid disorders: Secondary | ICD-10-CM | POA: Diagnosis not present

## 2020-01-03 DIAGNOSIS — Z130 Encounter for screening for diseases of the blood and blood-forming organs and certain disorders involving the immune mechanism: Secondary | ICD-10-CM | POA: Diagnosis not present

## 2020-01-03 DIAGNOSIS — Z0000 Encounter for general adult medical examination without abnormal findings: Secondary | ICD-10-CM | POA: Diagnosis not present

## 2020-02-19 DIAGNOSIS — Z0000 Encounter for general adult medical examination without abnormal findings: Secondary | ICD-10-CM | POA: Diagnosis not present

## 2020-02-19 DIAGNOSIS — I10 Essential (primary) hypertension: Secondary | ICD-10-CM | POA: Diagnosis not present

## 2020-02-19 DIAGNOSIS — Z23 Encounter for immunization: Secondary | ICD-10-CM | POA: Diagnosis not present

## 2020-03-19 DIAGNOSIS — L821 Other seborrheic keratosis: Secondary | ICD-10-CM | POA: Diagnosis not present

## 2020-03-19 DIAGNOSIS — L308 Other specified dermatitis: Secondary | ICD-10-CM | POA: Diagnosis not present

## 2020-03-19 DIAGNOSIS — L853 Xerosis cutis: Secondary | ICD-10-CM | POA: Diagnosis not present

## 2020-03-19 DIAGNOSIS — L570 Actinic keratosis: Secondary | ICD-10-CM | POA: Diagnosis not present

## 2020-03-19 DIAGNOSIS — Z85828 Personal history of other malignant neoplasm of skin: Secondary | ICD-10-CM | POA: Diagnosis not present

## 2020-04-15 ENCOUNTER — Encounter: Payer: Self-pay | Admitting: Obstetrics & Gynecology

## 2020-04-15 DIAGNOSIS — Z1231 Encounter for screening mammogram for malignant neoplasm of breast: Secondary | ICD-10-CM | POA: Diagnosis not present

## 2020-06-18 ENCOUNTER — Ambulatory Visit: Payer: BC Managed Care – PPO | Admitting: Obstetrics & Gynecology

## 2020-06-25 ENCOUNTER — Other Ambulatory Visit: Payer: Self-pay

## 2020-06-25 ENCOUNTER — Encounter: Payer: Self-pay | Admitting: Obstetrics & Gynecology

## 2020-06-25 ENCOUNTER — Other Ambulatory Visit (HOSPITAL_COMMUNITY)
Admission: RE | Admit: 2020-06-25 | Discharge: 2020-06-25 | Disposition: A | Discharge status: Home / Self Care | Payer: BC Managed Care – PPO | Source: Ambulatory Visit | Attending: Obstetrics & Gynecology | Admitting: Obstetrics & Gynecology

## 2020-06-25 ENCOUNTER — Ambulatory Visit (INDEPENDENT_AMBULATORY_CARE_PROVIDER_SITE_OTHER): Payer: BC Managed Care – PPO | Admitting: Obstetrics & Gynecology

## 2020-06-25 VITALS — BP 140/80 | HR 97 | Ht 71.5 in | Wt 182.0 lb

## 2020-06-25 DIAGNOSIS — Z9889 Other specified postprocedural states: Secondary | ICD-10-CM | POA: Diagnosis not present

## 2020-06-25 DIAGNOSIS — Z113 Encounter for screening for infections with a predominantly sexual mode of transmission: Secondary | ICD-10-CM

## 2020-06-25 DIAGNOSIS — Z780 Asymptomatic menopausal state: Secondary | ICD-10-CM | POA: Diagnosis not present

## 2020-06-25 DIAGNOSIS — Z01419 Encounter for gynecological examination (general) (routine) without abnormal findings: Secondary | ICD-10-CM | POA: Insufficient documentation

## 2020-06-25 NOTE — Progress Notes (Signed)
NAHJAE HOEG Dec 23, 1965 [id number]   History:    55 y.o. G2P2L2 Widowed. Children 21 and 78 yo. Boyfriend  TG:[id number] presenting for annual gyn exam   GHW:EXHBZJIRCVELF, well on no HRT. No PMB.  No pelvic pain. No dryness with IC.  Last Pap 10/2019 ASCUS/HPV HR Neg.  H/O CIN2, had a LEEP.  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  HPV 16-18-45 Negative. Breasts wnl. Urine/BMs wnl. BMI improved to 25.03. Fit and healthy nutrition.Fam h/o Osteoporosis.  Health Labs with Fam MD.  Colono at 64, repeat q5 yrs.    Past medical history,surgical history, family history and social history were all reviewed and documented in the EPIC chart.  Gynecologic History Patient's last menstrual period was 08/09/2016.  Obstetric History OB History  Gravida Para Term Preterm AB Living  2 2       2   SAB IAB Ectopic Multiple Live Births               # Outcome Date GA Lbr Len/2nd Weight Sex Delivery Anes PTL Lv  2 Para           1 Para              ROS: A ROS was performed and pertinent positives and negatives are included in the history.  GENERAL: No fevers or chills. HEENT: No change in vision, no earache, sore throat or sinus congestion. NECK: No pain or stiffness. CARDIOVASCULAR: No chest pain or pressure. No palpitations. PULMONARY: No shortness of breath, cough or wheeze. GASTROINTESTINAL: No abdominal pain, nausea, vomiting or diarrhea, melena or bright red blood per rectum. GENITOURINARY: No urinary frequency, urgency, hesitancy or dysuria. MUSCULOSKELETAL: No joint or muscle pain, no back pain, no recent trauma. DERMATOLOGIC: No rash, no itching, no lesions. ENDOCRINE: No polyuria, polydipsia, no heat or cold intolerance. No recent change in weight. HEMATOLOGICAL: No anemia or easy bruising or bleeding. NEUROLOGIC: No headache, seizures, numbness, tingling or weakness. PSYCHIATRIC: No depression, no loss of interest in normal activity or change in sleep pattern.     Exam:   BP  140/80   Pulse 97   Ht 5' 11.5" (1.816 m)   Wt 182 lb (82.6 kg)   LMP 08/09/2016   SpO2 98%   BMI 25.03 kg/m   Body mass index is 25.03 kg/m.  General appearance : Well developed well nourished female. No acute distress HEENT: Eyes: no retinal hemorrhage or exudates,  Neck supple, trachea midline, no carotid bruits, no thyroidmegaly Lungs: Clear to auscultation, no rhonchi or wheezes, or rib retractions  Heart: Regular rate and rhythm, no murmurs or gallops Breast:Examined in sitting and supine position were symmetrical in appearance, no palpable masses or tenderness,  no skin retraction, no nipple inversion, no nipple discharge, no skin discoloration, no axillary or supraclavicular lymphadenopathy Abdomen: no palpable masses or tenderness, no rebound or guarding Extremities: no edema or skin discoloration or tenderness  Pelvic: Vulva: Normal             Vagina: No gross lesions or discharge  Cervix: No gross lesions or discharge.  Pap/HPV HR, Gono-Chlam.  Uterus  AV, normal size, shape and consistency, non-tender and mobile  Adnexa  Without masses or tenderness  Anus: Normal   Assessment/Plan:  55 y.o. female for annual exam   1. Encounter for routine gynecological examination with Papanicolaou smear of cervix Normal gynecologic exam in menopause.  Pap with high-risk HPV done today.  Breast exam normal.  Screening mammogram in March 2022  was negative.  Colonoscopy 4 years ago.  Health labs with family physician.  Improved body mass index at 25.03.  Good fitness and healthy nutrition. - Cytology - PAP( [place])  2. H/O LEEP - Cytology - PAP( )  3. Postmenopause Well on no HRT.  No PMB.  Vit D supplements, Ca++ 1.5 g/d total, regular weight bearing physical activities to continue.  4. Screen for STD (sexually transmitted disease) -Gono-Chlam done on Pap  Other orders - Bioflavonoid Products (BIOFLEX) TABS; Take by mouth.   [name] MD, 11:42 AM  06/25/2020

## 2020-06-28 LAB — CYTOLOGY - PAP
Chlamydia: NEGATIVE
Comment: NEGATIVE
Comment: NEGATIVE
Comment: NORMAL
Diagnosis: NEGATIVE
High risk HPV: NEGATIVE
Neisseria Gonorrhea: NEGATIVE

## 2020-07-02 DIAGNOSIS — E782 Mixed hyperlipidemia: Secondary | ICD-10-CM | POA: Diagnosis not present

## 2020-07-30 DIAGNOSIS — R748 Abnormal levels of other serum enzymes: Secondary | ICD-10-CM | POA: Diagnosis not present

## 2020-07-30 DIAGNOSIS — Z23 Encounter for immunization: Secondary | ICD-10-CM | POA: Diagnosis not present

## 2020-07-30 DIAGNOSIS — I10 Essential (primary) hypertension: Secondary | ICD-10-CM | POA: Diagnosis not present

## 2020-12-09 DIAGNOSIS — C44319 Basal cell carcinoma of skin of other parts of face: Secondary | ICD-10-CM | POA: Diagnosis not present

## 2020-12-09 DIAGNOSIS — Z85828 Personal history of other malignant neoplasm of skin: Secondary | ICD-10-CM | POA: Diagnosis not present

## 2020-12-09 DIAGNOSIS — L570 Actinic keratosis: Secondary | ICD-10-CM | POA: Diagnosis not present

## 2020-12-09 DIAGNOSIS — L820 Inflamed seborrheic keratosis: Secondary | ICD-10-CM | POA: Diagnosis not present

## 2020-12-09 DIAGNOSIS — L821 Other seborrheic keratosis: Secondary | ICD-10-CM | POA: Diagnosis not present

## 2020-12-09 DIAGNOSIS — C4441 Basal cell carcinoma of skin of scalp and neck: Secondary | ICD-10-CM | POA: Diagnosis not present

## 2020-12-09 DIAGNOSIS — D225 Melanocytic nevi of trunk: Secondary | ICD-10-CM | POA: Diagnosis not present

## 2021-01-13 DIAGNOSIS — C44319 Basal cell carcinoma of skin of other parts of face: Secondary | ICD-10-CM | POA: Diagnosis not present

## 2021-04-01 ENCOUNTER — Other Ambulatory Visit: Payer: Self-pay

## 2021-04-01 ENCOUNTER — Ambulatory Visit (INDEPENDENT_AMBULATORY_CARE_PROVIDER_SITE_OTHER): Payer: BC Managed Care – PPO | Admitting: Sports Medicine

## 2021-04-01 VITALS — BP 112/76 | Ht 71.5 in | Wt 185.0 lb

## 2021-04-01 DIAGNOSIS — M25552 Pain in left hip: Secondary | ICD-10-CM

## 2021-04-02 NOTE — Progress Notes (Signed)
° °  Subjective:    Patient ID: Tasha Anderson, female    DOB: May 11, 1965, 56 y.o.   MRN: [id number]  HPI chief complaint: Left hip pain  Tasha Anderson is a very pleasant 56 year old female that comes in today complaining of posterior lateral left hip pain has been present for several years.  No injury that she can recall.  She was evaluated at [facility] several years ago and was told that she had a slight curve in her low back.  Cortisone injection was administered and she was referred to physical therapy.  Physical therapy was helpful.  Most recently she began to experience radiating pain down the lateral aspect of her left leg into the left lower leg.  It was very uncomfortable but has eased off since.  She has been taking over-the-counter Advil 200 mg as needed and it has been somewhat helpful.  Her symptoms are most noticeable when getting up out of a chair or with prolonged walking.  She does endorse some intermittent groin pain as well.  No numbness or tingling.  No recent imaging.  No prior surgeries on the low back or left hip.  Past medical history reviewed Medications reviewed Allergies reviewed    Review of Systems As above    Objective:   Physical Exam  Well-developed, well-nourished.  No acute distress  Lumbar spine: Patient has full painless lumbar flexion.  She does have reproducible pain with extension.  She is tender to palpation primarily along the piriformis with minimal tenderness to palpation over the greater trochanter.  Smooth painless hip range of motion with a negative logroll.  Significant hip abductor weakness on the left compared to the right.  Positive Trendelenburg on the left and negative on the right.  No atrophy seen.  Remainder of her strength is 5/5 in both lower extremities.  Reflexes are brisk and equal at the Achilles and patellar tendons bilaterally.       Assessment & Plan:   Chronic posterior lateral hip pain  Some of her symptoms may be  originating from lumbar facet arthropathy.  I would like to get an x-ray of her lumbar spine to evaluate further.  Nonetheless, she has obvious hip weakness on exam today.  I discussed home exercise program versus a return to physical therapy (she had excellent results in the past working with Raeford Razor).  She would like to start with her home exercises first.  Follow-up with me in 6 weeks for reevaluation.  Call with questions or concerns in the interim.  This note was dictated using Dragon naturally speaking software and may contain errors in syntax, spelling, or content which have not been identified prior to signing this note.

## 2021-04-21 DIAGNOSIS — Z1231 Encounter for screening mammogram for malignant neoplasm of breast: Secondary | ICD-10-CM | POA: Diagnosis not present

## 2021-04-25 ENCOUNTER — Encounter: Payer: Self-pay | Admitting: Obstetrics & Gynecology

## 2021-05-19 ENCOUNTER — Ambulatory Visit (INDEPENDENT_AMBULATORY_CARE_PROVIDER_SITE_OTHER): Payer: BC Managed Care – PPO | Admitting: Sports Medicine

## 2021-05-19 VITALS — BP 116/84 | Ht 72.0 in | Wt 185.0 lb

## 2021-05-19 DIAGNOSIS — M25552 Pain in left hip: Secondary | ICD-10-CM

## 2021-05-20 NOTE — Progress Notes (Signed)
? ?  Subjective:  ? ? Patient ID: Tasha Anderson, female    DOB: 01/30/1966, 56 y.o.   MRN: [id number] ? ?HPI ? ?Tasha Anderson presents today for follow-up on left hip pain.  She has noticed improvement with her home exercises.  She is pleased with her progress to date. ? ? ? ?Review of Systems ?As above ?   ?Objective:  ? Physical Exam ? ?Well-developed, well-nourished.  No acute distress ? ?Left hip: Smooth painless hip range of motion with a negative logroll.  Much better hip abductor strength than on previous visit.  Negative Trendelenburg.  Neurovascularly intact distally. ? ? ?   ?Assessment & Plan:  ? ?Improving left hip pain ? ?Tasha Anderson has made excellent progress with her home exercises.  Her left hip strength is much better.  I think she can continue with her home exercises and begin to increase activity as tolerated.  Although I did suggest an x-ray of her lumbar spine at her last visit, I do not think she necessarily needs that at this point in time.  I also do not believe she needs formal physical therapy.  She will follow-up with me as needed. ? ?This note was dictated using Dragon naturally speaking software and may contain errors in syntax, spelling, or content which have not been identified prior to signing this note.  ?

## 2021-06-27 ENCOUNTER — Ambulatory Visit: Payer: BC Managed Care – PPO | Admitting: Obstetrics & Gynecology

## 2021-07-18 ENCOUNTER — Ambulatory Visit (INDEPENDENT_AMBULATORY_CARE_PROVIDER_SITE_OTHER): Payer: BC Managed Care – PPO | Admitting: Obstetrics & Gynecology

## 2021-07-18 ENCOUNTER — Encounter: Payer: Self-pay | Admitting: Obstetrics & Gynecology

## 2021-07-18 ENCOUNTER — Other Ambulatory Visit (HOSPITAL_COMMUNITY)
Admission: RE | Admit: 2021-07-18 | Discharge: 2021-07-18 | Disposition: A | Discharge status: Home / Self Care | Payer: BC Managed Care – PPO | Source: Ambulatory Visit | Attending: Obstetrics & Gynecology | Admitting: Obstetrics & Gynecology

## 2021-07-18 VITALS — BP 110/72 | HR 88 | Ht 70.5 in | Wt 189.0 lb

## 2021-07-18 DIAGNOSIS — Z9889 Other specified postprocedural states: Secondary | ICD-10-CM

## 2021-07-18 DIAGNOSIS — Z780 Asymptomatic menopausal state: Secondary | ICD-10-CM

## 2021-07-18 DIAGNOSIS — Z01419 Encounter for gynecological examination (general) (routine) without abnormal findings: Secondary | ICD-10-CM | POA: Insufficient documentation

## 2021-07-18 NOTE — Progress Notes (Signed)
Tasha Anderson 05-Jun-1965 [id number]   History:    56 y.o. G2P2L2 Widowed. Children 49 and 7 yo.  Boyfriend.   RP:  Established patient presenting for annual gyn exam    HPI: Postmenopause, well on no HRT. No PMB.  No pelvic pain.  No dryness with IC.  Last Pap 06/2020 Neg/HPV HR Neg.  H/O CIN2, had a LEEP. HPV 16-18-45 Negative.  Pap reflex today. Breasts wnl. Mammo Neg 04/2021. Urine/BMs wnl.  BMI 26.74.  Fit and healthy nutrition.  Fam h/o Osteoporosis.  [facility] with Fam MD.  Harriet Masson 2018, repeat q5 yrs, so this year.      Past medical history,surgical history, family history and social history were all reviewed and documented in the EPIC chart.  Gynecologic History Patient's last menstrual period was 08/09/2016.  Obstetric History OB History  Gravida Para Term Preterm AB Living  '2 2       2  '$ SAB IAB Ectopic Multiple Live Births               # Outcome Date GA Lbr Len/2nd Weight Sex Delivery Anes PTL Lv  2 Para           1 Para              ROS: A ROS was performed and pertinent positives and negatives are included in the history.  GENERAL: No fevers or chills. HEENT: No change in vision, no earache, sore throat or sinus congestion. NECK: No pain or stiffness. CARDIOVASCULAR: No chest pain or pressure. No palpitations. PULMONARY: No shortness of breath, cough or wheeze. GASTROINTESTINAL: No abdominal pain, nausea, vomiting or diarrhea, melena or bright red blood per rectum. GENITOURINARY: No urinary frequency, urgency, hesitancy or dysuria. MUSCULOSKELETAL: No joint or muscle pain, no back pain, no recent trauma. DERMATOLOGIC: No rash, no itching, no lesions. ENDOCRINE: No polyuria, polydipsia, no heat or cold intolerance. No recent change in weight. HEMATOLOGICAL: No anemia or easy bruising or bleeding. NEUROLOGIC: No headache, seizures, numbness, tingling or weakness. PSYCHIATRIC: No depression, no loss of interest in normal activity or change in sleep pattern.      Exam:   BP 110/72   Pulse 88   Ht 5' 10.5" (1.791 m)   Wt 189 lb (85.7 kg)   LMP 08/09/2016   SpO2 99%   BMI 26.74 kg/m   Body mass index is 26.74 kg/m.  General appearance : Well developed well nourished female. No acute distress HEENT: Eyes: no retinal hemorrhage or exudates,  Neck supple, trachea midline, no carotid bruits, no thyroidmegaly Lungs: Clear to auscultation, no rhonchi or wheezes, or rib retractions  Heart: Regular rate and rhythm, no murmurs or gallops Breast:Examined in sitting and supine position were symmetrical in appearance, no palpable masses or tenderness,  no skin retraction, no nipple inversion, no nipple discharge, no skin discoloration, no axillary or supraclavicular lymphadenopathy Abdomen: no palpable masses or tenderness, no rebound or guarding Extremities: no edema or skin discoloration or tenderness  Pelvic: Vulva: Normal             Vagina: No gross lesions or discharge  Cervix: No gross lesions or discharge.  Pap reflex done.  Uterus  AV, normal size, shape and consistency, non-tender and mobile  Adnexa  Without masses or tenderness  Anus: Normal   Assessment/Plan:  56 y.o. female for annual exam   1. Encounter for routine gynecological examination with Papanicolaou smear of cervix Postmenopause, well on no HRT. No PMB.  No pelvic pain.  No dryness with IC.  Last Pap 06/2020 Neg/HPV HR Neg.  H/O CIN2, had a LEEP. HPV 16-18-45 Negative.  Pap reflex today. Breasts wnl. Mammo Neg 04/2021. Urine/BMs wnl.  BMI 26.74.  Fit and healthy nutrition.  Fam h/o Osteoporosis.  Health Labs with Fam MD.  Harriet Masson 2018, repeat q5 yrs, so this year.   - Cytology - PAP( [place])  2. H/O LEEP - Cytology - PAP( [place])  3. Postmenopause  Postmenopause, well on no HRT. No PMB.  No pelvic pain.  No dryness with IC.   [name] MD, 10:39 AM 07/18/2021

## 2021-07-22 LAB — CYTOLOGY - PAP: Diagnosis: NEGATIVE

## 2021-09-25 DIAGNOSIS — D2239 Melanocytic nevi of other parts of face: Secondary | ICD-10-CM | POA: Diagnosis not present

## 2021-09-25 DIAGNOSIS — L570 Actinic keratosis: Secondary | ICD-10-CM | POA: Diagnosis not present

## 2021-09-25 DIAGNOSIS — D485 Neoplasm of uncertain behavior of skin: Secondary | ICD-10-CM | POA: Diagnosis not present

## 2021-10-23 DIAGNOSIS — Z8371 Family history of colonic polyps: Secondary | ICD-10-CM | POA: Diagnosis not present

## 2021-10-23 DIAGNOSIS — D120 Benign neoplasm of cecum: Secondary | ICD-10-CM | POA: Diagnosis not present

## 2021-10-23 DIAGNOSIS — Z1211 Encounter for screening for malignant neoplasm of colon: Secondary | ICD-10-CM | POA: Diagnosis not present

## 2021-12-10 ENCOUNTER — Ambulatory Visit (INDEPENDENT_AMBULATORY_CARE_PROVIDER_SITE_OTHER): Payer: BC Managed Care – PPO | Admitting: Sports Medicine

## 2021-12-10 ENCOUNTER — Ambulatory Visit: Payer: Self-pay

## 2021-12-10 VITALS — BP 126/90 | Ht 71.0 in | Wt 185.0 lb

## 2021-12-10 DIAGNOSIS — M25572 Pain in left ankle and joints of left foot: Secondary | ICD-10-CM

## 2021-12-10 NOTE — Progress Notes (Signed)
  Tasha Anderson - 56 y.o. female MRN [id number]  Date of birth: 1965/04/08    CHIEF COMPLAINT:   L ankle pain    SUBJECTIVE:   HPI:  Pleasant 25 old female comes to clinic to be evaluated for left ankle pain.  About 1 week ago she took an awkward step while mowing the lawn off of a retention wall.  She landed on her left foot in a plantarflexed position.  The ankle subsequently became swollen.  She denies any bruising.  He generally became painful to walk on.  She reports her pain is in the front of the ankle.  No pain laterally.  She tried icing and taking ibuprofen.  She did a hike over the weekend which aggravated it.  Today is actually feeling little bit better as she has stayed off of it today.  She wears [place] walking shoes.  She works as an Warden/ranger.  Denies any prior ankle trauma.  ROS:     See HPI  PERTINENT  PMH / PSH FH / / SH:  Past Medical, Surgical, Social, and Family History Reviewed & Updated in the EMR.  Pertinent findings include:  None  OBJECTIVE: BP (!) 126/90   Ht '5\' 11"'$  (1.803 m)   Wt 185 lb (83.9 kg)   LMP 08/09/2016   BMI 25.80 kg/m   Physical Exam:  Vital signs are reviewed.  GEN: Alert and oriented, NAD Pulm: Breathing unlabored PSY: normal mood, congruent affect  MSK: Left ankle -no obvious deformity effusion or erythema.  No ecchymoses.  She is nontender to palpation over the medial malleolus, lateral malleolus, navicular, base of fifth metatarsal.  She is nontender to palpation of the midfoot.  She has full range of motion at the ankle and 5/5 strength throughout.  She has a negative anterior drawer test, negative talar tilt test, negative Klieger test.  Negative fibular squeeze test.  She is neurovascularly intact distally.  ULTRASOUND: [place] L ankle:  - Lateral ligaments: ATFL intact - Anterior joint: No evidence of ankle effusion.  Tibiotalar joint appears normal. medial and posterior talar dome unremarkable  Impression: Unremarkable  left ankle ultrasound  ASSESSMENT & PLAN:  1.  Left ankle pain -This is acute uncomplicated problem.  It is getting better.  She had a stable ankle exam today.  Most of her pain is likely from contusion of the talar dome; I did not see any signs of effusion or cortical irregularity on ultrasound today.  Reassurance was provided.  Recommended RICE and supportive shoe wear.  She will follow-up as needed for this.  If no improvement, I recommend getting x-rays of the ankle.  Dortha Kern, MD PGY-4, Sports Medicine Fellow [place]  Addendum:  Patient seen in the office by fellow.  His history, exam, plan of care were precepted with me.  Karlton Lemon MD Kirt Boys

## 2021-12-11 DIAGNOSIS — L720 Epidermal cyst: Secondary | ICD-10-CM | POA: Diagnosis not present

## 2021-12-11 DIAGNOSIS — Z85828 Personal history of other malignant neoplasm of skin: Secondary | ICD-10-CM | POA: Diagnosis not present

## 2021-12-11 DIAGNOSIS — L821 Other seborrheic keratosis: Secondary | ICD-10-CM | POA: Diagnosis not present

## 2021-12-11 DIAGNOSIS — D485 Neoplasm of uncertain behavior of skin: Secondary | ICD-10-CM | POA: Diagnosis not present

## 2021-12-11 DIAGNOSIS — D2261 Melanocytic nevi of right upper limb, including shoulder: Secondary | ICD-10-CM | POA: Diagnosis not present

## 2021-12-11 DIAGNOSIS — L430 Hypertrophic lichen planus: Secondary | ICD-10-CM | POA: Diagnosis not present

## 2021-12-25 DIAGNOSIS — E782 Mixed hyperlipidemia: Secondary | ICD-10-CM | POA: Diagnosis not present

## 2021-12-25 DIAGNOSIS — I10 Essential (primary) hypertension: Secondary | ICD-10-CM | POA: Diagnosis not present

## 2021-12-25 DIAGNOSIS — Z0000 Encounter for general adult medical examination without abnormal findings: Secondary | ICD-10-CM | POA: Diagnosis not present

## 2021-12-25 DIAGNOSIS — Z6826 Body mass index (BMI) 26.0-26.9, adult: Secondary | ICD-10-CM | POA: Diagnosis not present

## 2022-05-07 ENCOUNTER — Encounter: Payer: Self-pay | Admitting: Obstetrics & Gynecology

## 2022-11-02 ENCOUNTER — Encounter: Payer: Self-pay | Admitting: Obstetrics and Gynecology

## 2022-11-02 ENCOUNTER — Other Ambulatory Visit (HOSPITAL_COMMUNITY)
Admission: RE | Admit: 2022-11-02 | Discharge: 2022-11-02 | Disposition: A | Discharge status: Home / Self Care | Payer: BC Managed Care – PPO | Source: Ambulatory Visit | Attending: Obstetrics and Gynecology | Admitting: Obstetrics and Gynecology

## 2022-11-02 ENCOUNTER — Ambulatory Visit (INDEPENDENT_AMBULATORY_CARE_PROVIDER_SITE_OTHER): Payer: BC Managed Care – PPO | Admitting: Obstetrics and Gynecology

## 2022-11-02 VITALS — BP 120/68 | HR 70 | Resp 16 | Ht 70.75 in | Wt 183.0 lb

## 2022-11-02 DIAGNOSIS — E28319 Asymptomatic premature menopause: Secondary | ICD-10-CM | POA: Diagnosis not present

## 2022-11-02 DIAGNOSIS — Z8262 Family history of osteoporosis: Secondary | ICD-10-CM

## 2022-11-02 DIAGNOSIS — N871 Moderate cervical dysplasia: Secondary | ICD-10-CM

## 2022-11-02 DIAGNOSIS — M898X9 Other specified disorders of bone, unspecified site: Secondary | ICD-10-CM | POA: Diagnosis not present

## 2022-11-02 DIAGNOSIS — Z01419 Encounter for gynecological examination (general) (routine) without abnormal findings: Secondary | ICD-10-CM | POA: Insufficient documentation

## 2022-11-02 NOTE — Addendum Note (Signed)
Addended by: Earley Favor on: 11/02/2022 11:41 AM   Modules accepted: Orders
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
## 2022-11-04 LAB — CYTOLOGY - PAP
Comment: NEGATIVE
Diagnosis: NEGATIVE
High risk HPV: NEGATIVE

## 2023-02-23 ENCOUNTER — Other Ambulatory Visit: Payer: Self-pay | Admitting: *Deleted

## 2023-02-23 DIAGNOSIS — E28319 Asymptomatic premature menopause: Secondary | ICD-10-CM

## 2023-02-23 DIAGNOSIS — Z8262 Family history of osteoporosis: Secondary | ICD-10-CM

## 2023-02-23 DIAGNOSIS — M898X9 Other specified disorders of bone, unspecified site: Secondary | ICD-10-CM

## 2023-03-01 ENCOUNTER — Ambulatory Visit (INDEPENDENT_AMBULATORY_CARE_PROVIDER_SITE_OTHER): Payer: BC Managed Care – PPO | Admitting: Sports Medicine

## 2023-03-01 ENCOUNTER — Other Ambulatory Visit: Payer: Self-pay

## 2023-03-01 ENCOUNTER — Ambulatory Visit (INDEPENDENT_AMBULATORY_CARE_PROVIDER_SITE_OTHER): Payer: BC Managed Care – PPO | Admitting: Orthopaedic Surgery

## 2023-03-01 ENCOUNTER — Ambulatory Visit (INDEPENDENT_AMBULATORY_CARE_PROVIDER_SITE_OTHER): Payer: BC Managed Care – PPO

## 2023-03-01 ENCOUNTER — Ambulatory Visit (INDEPENDENT_AMBULATORY_CARE_PROVIDER_SITE_OTHER): Payer: Self-pay

## 2023-03-01 ENCOUNTER — Encounter: Payer: Self-pay | Admitting: Orthopaedic Surgery

## 2023-03-01 DIAGNOSIS — M25551 Pain in right hip: Secondary | ICD-10-CM

## 2023-03-01 DIAGNOSIS — M1611 Unilateral primary osteoarthritis, right hip: Secondary | ICD-10-CM | POA: Diagnosis not present

## 2023-03-01 MED ORDER — METHYLPREDNISOLONE ACETATE 40 MG/ML IJ SUSP
80.0000 mg | INTRAMUSCULAR | Status: AC | PRN
  Administered 2023-03-01: 80 mg via INTRA_ARTICULAR

## 2023-03-01 MED ORDER — LIDOCAINE HCL 1 % IJ SOLN
4.0000 mL | INTRAMUSCULAR | Status: AC | PRN
  Administered 2023-03-01: 4 mL

## 2023-03-01 NOTE — Progress Notes (Signed)
   Procedure Note  Patient: Tasha Anderson             Date of Birth: [birth year]           MRN: [id number]             Visit Date: 03/01/2023  Procedures: Visit Diagnoses:  1. Unilateral primary osteoarthritis, right hip   2. Pain in right hip    Large Joint Inj: R hip joint on 03/01/2023 3:02 PM Indications: pain Details: 22 G 3.5 in needle, ultrasound-guided anterior approach Medications: 4 mL lidocaine 1 %; 80 mg methylPREDNISolone acetate 40 MG/ML Outcome: tolerated well, no immediate complications  Procedure: US-guided intra-articular hip injection, Right After discussion on risks/benefits/indications and informed verbal consent was obtained, a timeout was performed. Patient was lying supine on exam table. The hip was cleaned with betadine and alcohol swabs. Then utilizing ultrasound guidance, the patient's femoral head and neck junction was identified and subsequently injected with 4:2 lidocaine:depomedrol via an in-plane approach with ultrasound visualization of the injectate administered into the hip joint. Patient tolerated procedure well without immediate complications.  Procedure, treatment alternatives, risks and benefits explained, specific risks discussed. Consent was given by the patient. Immediately prior to procedure a time out was called to verify the correct patient, procedure, equipment, support staff and site/side marked as required. Patient was prepped and draped in the usual sterile fashion.     - follow-up with Dr. Magnus Ivan as indicated; I am happy to see them as needed  Madelyn Brunner, DO Primary Care Sports Medicine Physician  [facility] - Orthopedics  This note was dictated using Dragon naturally speaking software and may contain errors in syntax, spelling, or content which have not been identified prior to signing this note.

## 2023-03-01 NOTE — Progress Notes (Signed)
The patient is a very pleasant 57 year old female who comes in for evaluation treatment of chronic right hip pain and stiffness.  She has had trochanteric bursitis in the past and had injections over the trochanteric area.  She does have a family history of family members having joint replacements at a younger age.  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  She does report right hip and groin pain but also some low back pain.  She is not obese and not a diabetic.  I was able to review medications and past medical history within epic.  Examination of her right hip does show stiffness with internal and external rotation.  There is no blocks to rotation but it definitely has less motion than her left hip which has normal motion.  There is deafly some pain in the groin on motion and a little bit of pain over the trochanteric area of the right hip.  She has negative straight leg raise bilaterally.  X-rays of her pelvis and right hip does show evidence of previous femoral acetabular impingement that is worsening.  It is showing significant arthritis now with superior lateral joint space narrowing as well as flattening of the femoral head and osteophytes around the superior lateral femoral head neck area.  The left hip joint space is better maintained.  X-rays of the lumbar spine shows some degenerative changes at several levels with a little bit of a curvature that may be more postural related.  We talked in length in detail about her right hip.  She is wanting to stay the conservative route for now which I agree with.  She would like to get back into skiing at some point given that she is from the [place] area and her son still lives in [place].  I have recommended at least a one-time intra-articular steroid injection in her right hip joint they can hopefully be therapeutic for her given her upcoming job.  We will see when we can get this set up with Dr.  Shon Baton and then I will see her at some point if things are worsening for her.  She agrees with this and did take a picture of her x-rays.  All questions and concerns were addressed and answered.

## 2023-03-05 ENCOUNTER — Ambulatory Visit (HOSPITAL_BASED_OUTPATIENT_CLINIC_OR_DEPARTMENT_OTHER)
Admission: RE | Admit: 2023-03-05 | Discharge: 2023-03-05 | Disposition: A | Discharge status: Home / Self Care | Payer: BC Managed Care – PPO | Source: Ambulatory Visit | Attending: Obstetrics and Gynecology | Admitting: Obstetrics and Gynecology

## 2023-03-05 DIAGNOSIS — M898X9 Other specified disorders of bone, unspecified site: Secondary | ICD-10-CM | POA: Insufficient documentation

## 2023-03-05 DIAGNOSIS — E28319 Asymptomatic premature menopause: Secondary | ICD-10-CM | POA: Insufficient documentation

## 2023-03-05 DIAGNOSIS — Z8262 Family history of osteoporosis: Secondary | ICD-10-CM | POA: Insufficient documentation

## 2023-03-09 ENCOUNTER — Encounter: Payer: Self-pay | Admitting: Obstetrics and Gynecology

## 2023-05-05 ENCOUNTER — Encounter: Payer: Self-pay | Admitting: Orthopaedic Surgery

## 2023-05-05 ENCOUNTER — Ambulatory Visit (INDEPENDENT_AMBULATORY_CARE_PROVIDER_SITE_OTHER): Admitting: Orthopaedic Surgery

## 2023-05-05 DIAGNOSIS — M25551 Pain in right hip: Secondary | ICD-10-CM

## 2023-05-05 DIAGNOSIS — M1611 Unilateral primary osteoarthritis, right hip: Secondary | ICD-10-CM | POA: Diagnosis not present

## 2023-05-05 NOTE — Progress Notes (Signed)
 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  She unfortunately has significant right hip pain and femoral acetabular impingement with osteoarthritis.  This has been getting worse for over a year now.  We did send her for an intra-articular steroid injection in her right hip joint under ultrasound and this helped minimally.  She is at the point where she does wish to proceed with total hip arthroplasty surgery.  Her right hip pain is daily and it is detriment affecting her mobility, her quality of life and her actives daily living.  When I even see her get up from a seated position she has pain in that hip and walks with a limp.  On exam her right hip is stiff with internal and external rotation and causes significant pain in the groin with rotation.  We had a long and thorough discussion about hip replacement surgery.  I showed her hip replacement model and gave her handout about hip replacement surgery.  We discussed the risks and benefits of the surgery.  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  We talked about what to expect from an intraoperative and postoperative standpoint.  We will work on getting her surgery scheduled in the near future and we will be in touch with scheduling surgery.  She will likely have this in the month of May because of travel schedules.

## 2023-05-13 LAB — HM MAMMOGRAPHY

## 2023-05-21 ENCOUNTER — Encounter: Payer: Self-pay | Admitting: Obstetrics and Gynecology

## 2023-06-24 NOTE — Pre-Procedure Instructions (Signed)
 Surgical Instructions   Your procedure is scheduled on Tuesday, May 27th. Report to [facility] Main Entrance "A" at 05:30 A.M., then check in with the Admitting office. Any questions or running late day of surgery: call [phone]  Questions prior to your surgery date: call [phone], Monday-Friday, 8am-4pm. If you experience any cold or flu symptoms such as cough, fever, chills, shortness of breath, etc. between now and your scheduled surgery, please notify us  at the above number.     Remember:  Do not eat after midnight the night before your surgery  You may drink clear liquids until 04:30 AM the morning of your surgery.   Clear liquids allowed are: Water, Non-Citrus Juices (without pulp), Carbonated Beverages, Clear Tea (no milk, honey, etc.), Black Coffee Only (NO MILK, CREAM OR POWDERED CREAMER of any kind), and Gatorade.  Patient Instructions  The night before surgery:  No food after midnight. ONLY clear liquids after midnight  The day of surgery (if you do NOT have diabetes):  Drink ONE (1) Pre-Surgery Clear Ensure by 04:30 AM the morning of surgery. Drink in one sitting. Do not sip.  This drink was given to you during your hospital  pre-op appointment visit.  Nothing else to drink after completing the  Pre-Surgery Clear Ensure.          If you have questions, please contact your surgeon's office.    Take these medicines the morning of surgery with A SIP OF WATER  loratadine (CLARITIN)    One week prior to surgery, STOP taking any Aspirin (unless otherwise instructed by your surgeon) Aleve, Naproxen, Ibuprofen, Motrin, Advil, Goody's, BC's, all herbal medications, fish oil, and non-prescription vitamins.                     Do NOT Smoke (Tobacco/Vaping) for 24 hours prior to your procedure.  If you use a CPAP at night, you may bring your mask/headgear for your overnight stay.   You will be asked to remove any contacts, glasses, piercing's, hearing aid's,  dentures/partials prior to surgery. Please bring cases for these items if needed.    Patients discharged the day of surgery will not be allowed to drive home, and someone needs to stay with them for 24 hours.  SURGICAL WAITING ROOM VISITATION Patients may have no more than 2 support people in the waiting area - these visitors may rotate.   Pre-op nurse will coordinate an appropriate time for 1 ADULT support person, who may not rotate, to accompany patient in pre-op.  Children under the age of 54 must have an adult with them who is not the patient and must remain in the main waiting area with an adult.  If the patient needs to stay at the hospital during part of their recovery, the visitor guidelines for inpatient rooms apply.  Please refer to the [facility] website for the visitor guidelines for any additional information.   If you received a COVID test during your pre-op visit  it is requested that you wear a mask when out in public, stay away from anyone that may not be feeling well and notify your surgeon if you develop symptoms. If you have been in contact with anyone that has tested positive in the last 10 days please notify you surgeon.      Pre-operative 5 CHG Bathing Instructions   You can play a key role in reducing the risk of infection after surgery. Your skin needs to be as free of  germs as possible. You can reduce the number of germs on your skin by washing with CHG (chlorhexidine gluconate) soap before surgery. CHG is an antiseptic soap that kills germs and continues to kill germs even after washing.   DO NOT use if you have an allergy to chlorhexidine/CHG or antibacterial soaps. If your skin becomes reddened or irritated, stop using the CHG and notify one of our RNs at [phone].   Please shower with the CHG soap starting 4 days before surgery using the following schedule:     Please keep in mind the following:  DO NOT shave, including legs and underarms, starting the  day of your first shower.   You may shave your face at any point before/day of surgery.  Place clean sheets on your bed the day you start using CHG soap. Use a clean washcloth (not used since being washed) for each shower. DO NOT sleep with pets once you start using the CHG.   CHG Shower Instructions:  Wash your face and private area with normal soap. If you choose to wash your hair, wash first with your normal shampoo.  After you use shampoo/soap, rinse your hair and body thoroughly to remove shampoo/soap residue.  Turn the water OFF and apply about 3 tablespoons (45 ml) of CHG soap to a CLEAN washcloth.  Apply CHG soap ONLY FROM YOUR NECK DOWN TO YOUR TOES (washing for 3-5 minutes)  DO NOT use CHG soap on face, private areas, open wounds, or sores.  Pay special attention to the area where your surgery is being performed.  If you are having back surgery, having someone wash your back for you may be helpful. Wait 2 minutes after CHG soap is applied, then you may rinse off the CHG soap.  Pat dry with a clean towel  Put on clean clothes/pajamas   If you choose to wear lotion, please use ONLY the CHG-compatible lotions that are listed below.  Additional instructions for the day of surgery: DO NOT APPLY any lotions, deodorants, cologne, or perfumes.   Do not bring valuables to the hospital. [facility] is not responsible for any belongings/valuables. Do not wear nail polish, gel polish, artificial nails, or any other type of covering on natural nails (fingers and toes) Do not wear jewelry or makeup Put on clean/comfortable clothes.  Please brush your teeth.  Ask your nurse before applying any prescription medications to the skin.     CHG Compatible Lotions   Aveeno Moisturizing lotion  Cetaphil Moisturizing Cream  Cetaphil Moisturizing Lotion  Clairol Herbal Essence Moisturizing Lotion, Dry Skin  Clairol Herbal Essence Moisturizing Lotion, Extra Dry Skin  Clairol Herbal Essence  Moisturizing Lotion, Normal Skin  Curel Age Defying Therapeutic Moisturizing Lotion with Alpha Hydroxy  Curel Extreme Care Body Lotion  Curel Soothing Hands Moisturizing Hand Lotion  Curel Therapeutic Moisturizing Cream, Fragrance-Free  Curel Therapeutic Moisturizing Lotion, Fragrance-Free  Curel Therapeutic Moisturizing Lotion, Original Formula  Eucerin Daily Replenishing Lotion  Eucerin Dry Skin Therapy Plus Alpha Hydroxy Crme  Eucerin Dry Skin Therapy Plus Alpha Hydroxy Lotion  Eucerin Original Crme  Eucerin Original Lotion  Eucerin Plus Crme Eucerin Plus Lotion  Eucerin TriLipid Replenishing Lotion  Keri Anti-Bacterial Hand Lotion  Keri Deep Conditioning Original Lotion Dry Skin Formula Softly Scented  Keri Deep Conditioning Original Lotion, Fragrance Free Sensitive Skin Formula  Keri Lotion Fast Absorbing Fragrance Free Sensitive Skin Formula  Keri Lotion Fast Absorbing Softly Scented Dry Skin Formula  Keri Original Lotion  Keri Skin  Renewal Lotion WellPoint Smooth Lotion  Keri Silky Smooth Sensitive Skin Lotion  Nivea Body Creamy Conditioning Oil  Nivea Body Extra Enriched Teacher, adult education Moisturizing Lotion Nivea Crme  Nivea Skin Firming Lotion  NutraDerm 30 Skin Lotion  NutraDerm Skin Lotion  NutraDerm Therapeutic Skin Cream  NutraDerm Therapeutic Skin Lotion  ProShield Protective Hand Cream  Provon moisturizing lotion  Please read over the following fact sheets that you were given.

## 2023-06-25 ENCOUNTER — Other Ambulatory Visit: Payer: Self-pay

## 2023-06-25 ENCOUNTER — Encounter (HOSPITAL_COMMUNITY): Payer: Self-pay

## 2023-06-25 ENCOUNTER — Encounter (HOSPITAL_COMMUNITY)
Admission: RE | Admit: 2023-06-25 | Discharge: 2023-06-25 | Disposition: A | Discharge status: Home / Self Care | Source: Ambulatory Visit | Attending: Orthopaedic Surgery | Admitting: Orthopaedic Surgery

## 2023-06-25 VITALS — BP 144/92 | HR 76 | Temp 98.4°F | Resp 18 | Ht 71.0 in | Wt 192.7 lb

## 2023-06-25 DIAGNOSIS — Z01818 Encounter for other preprocedural examination: Secondary | ICD-10-CM

## 2023-06-25 DIAGNOSIS — M1611 Unilateral primary osteoarthritis, right hip: Secondary | ICD-10-CM | POA: Insufficient documentation

## 2023-06-25 DIAGNOSIS — Z01812 Encounter for preprocedural laboratory examination: Secondary | ICD-10-CM | POA: Insufficient documentation

## 2023-06-25 HISTORY — DX: Unilateral primary osteoarthritis, right hip: M16.11

## 2023-06-25 LAB — BASIC METABOLIC PANEL WITH GFR
Anion gap: 10 (ref 5–15)
BUN: 12 mg/dL (ref 6–20)
CO2: 26 mmol/L (ref 22–32)
Calcium: 10.1 mg/dL (ref 8.9–10.3)
Chloride: 101 mmol/L (ref 98–111)
Creatinine, Ser: 0.96 mg/dL (ref 0.44–1.00)
GFR, Estimated: >60 mL/min (ref >60)
Glucose, Bld: 95 mg/dL (ref 70–99)
Potassium: 5.1 mmol/L (ref 3.5–5.1)
Sodium: 137 mmol/L (ref 135–145)

## 2023-06-25 LAB — TYPE AND SCREEN
ABO/RH(D): O POS
Antibody Screen: NEGATIVE

## 2023-06-25 LAB — CBC
HCT: 39.8 % (ref 36.0–46.0)
Hemoglobin: 13.3 g/dL (ref 12.0–15.0)
MCH: 33 pg (ref 26.0–34.0)
MCHC: 33.4 g/dL (ref 30.0–36.0)
MCV: 98.8 fL (ref 80.0–100.0)
Platelets: 219 10*3/uL (ref 150–400)
RBC: 4.03 MIL/uL (ref 3.87–5.11)
RDW: 11.9 % (ref 11.5–15.5)
WBC: 6.4 10*3/uL (ref 4.0–10.5)
nRBC: 0 % (ref 0.0–0.2)

## 2023-06-25 LAB — SURGICAL PCR SCREEN
MRSA, PCR: NEGATIVE
Staphylococcus aureus: NEGATIVE

## 2023-06-25 NOTE — Progress Notes (Signed)
 PCP - Jewell Mose, NP Cardiologist - denies  PPM/ICD - denies   Chest x-ray - denies EKG - 04/29/23- tracing requested Stress Test - denies ECHO - denies Cardiac Cath - denies  Sleep Study - denies   DM- denies  Last dose of GLP1 agonist-  n/a   ASA/Blood Thinner Instructions: n/a  ERAS Protcol - clears until 0430 PRE-SURGERY Ensure given  COVID TEST- n/a   Anesthesia review: no  Patient denies shortness of breath, fever, cough and chest pain at PAT appointment   All instructions explained to the patient, with a verbal understanding of the material. Patient agrees to go over the instructions while at home for a better understanding.  The opportunity to ask questions was provided.

## 2023-07-06 ENCOUNTER — Encounter (HOSPITAL_COMMUNITY): Admission: RE | Payer: Self-pay | Source: Home / Self Care

## 2023-07-06 ENCOUNTER — Ambulatory Visit (HOSPITAL_COMMUNITY): Admission: RE | Admit: 2023-07-06 | Source: Home / Self Care | Admitting: Orthopaedic Surgery

## 2023-07-06 DIAGNOSIS — M1611 Unilateral primary osteoarthritis, right hip: Secondary | ICD-10-CM

## 2023-07-06 SURGERY — ARTHROPLASTY, HIP, TOTAL, ANTERIOR APPROACH
Anesthesia: Spinal | Site: Hip | Laterality: Right

## 2023-07-19 ENCOUNTER — Encounter (HOSPITAL_COMMUNITY): Payer: Self-pay | Admitting: Orthopaedic Surgery

## 2023-07-19 ENCOUNTER — Other Ambulatory Visit: Payer: Self-pay

## 2023-07-19 ENCOUNTER — Encounter: Admitting: Orthopaedic Surgery

## 2023-07-19 DIAGNOSIS — M1611 Unilateral primary osteoarthritis, right hip: Principal | ICD-10-CM | POA: Insufficient documentation
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
## 2023-07-19 NOTE — Anesthesia Preprocedure Evaluation (Signed)
 Anesthesia Evaluation  Patient identified by MRN, date of birth, ID band Patient awake    Reviewed: Allergy & Precautions, NPO status , Patient's Chart, lab work & pertinent test results  History of Anesthesia Complications Negative for: history of anesthetic complications  Airway Mallampati: II  TM Distance: >3 FB Neck ROM: Full    Dental  (+) Dental Advisory Given   Pulmonary    breath sounds clear to auscultation       Cardiovascular hypertension, Pt. on medications (-) angina  Rhythm:Regular Rate:Normal     Neuro/Psych  Headaches    GI/Hepatic negative GI ROS, Neg liver ROS,,,  Endo/Other  BMI 27  Renal/GU negative Renal ROS     Musculoskeletal   Abdominal   Peds  Hematology 06/22/2023 Hb 13.3, plt 219k   Anesthesia Other Findings   Reproductive/Obstetrics                              Anesthesia Physical Anesthesia Plan  ASA: 2  Anesthesia Plan: Spinal   Post-op Pain Management: Tylenol PO (pre-op)*   Induction:   PONV Risk Score and Plan: 2 and Treatment may vary due to age or medical condition  Airway Management Planned: Natural Airway and Simple Face Mask  Additional Equipment: None  Intra-op Plan:   Post-operative Plan:   Informed Consent: I have reviewed the patients History and Physical, chart, labs and discussed the procedure including the risks, benefits and alternatives for the proposed anesthesia with the patient or authorized representative who has indicated his/her understanding and acceptance.     Dental advisory given  Plan Discussed with: CRNA and Surgeon  Anesthesia Plan Comments:          Anesthesia Quick Evaluation

## 2023-07-19 NOTE — Progress Notes (Signed)
 SDW CALL  Patient was given pre-op instructions over the phone. The opportunity was given for the patient to ask questions. No further questions asked. Patient verbalized understanding of instructions given.   PCP - Jewell Mose, NP  Cardiologist -   PPM/ICD - denies Device Orders - n/a Rep Notified - n/a  Chest x-ray -  EKG - 04-29-23 requested tracing Stress Test -  ECHO -  Cardiac Cath -   Sleep Study - denies CPAP - n/a  Dm -denies  Blood Thinner Instructions: denies Aspirin Instructions:n/a  ERAS Protcol - ERAS till 4:30 PRE-SURGERY Ensure or G2-   COVID TEST-    Anesthesia review: no  Patient denies shortness of breath, fever, cough and chest pain over the phone call   All instructions explained to the patient, with a verbal understanding of the material. Patient agrees to go over the instructions while at home for a better understanding.

## 2023-07-20 ENCOUNTER — Observation Stay (HOSPITAL_COMMUNITY)
Admission: RE | Admit: 2023-07-20 | Discharge: 2023-07-21 | Disposition: A | Discharge status: Home / Self Care | Attending: Orthopaedic Surgery | Admitting: Orthopaedic Surgery

## 2023-07-20 ENCOUNTER — Encounter (HOSPITAL_COMMUNITY): Payer: Self-pay | Admitting: Orthopaedic Surgery

## 2023-07-20 ENCOUNTER — Other Ambulatory Visit: Payer: Self-pay

## 2023-07-20 ENCOUNTER — Encounter (HOSPITAL_COMMUNITY)
Admission: RE | Disposition: A | Discharge status: Home / Self Care | Payer: Self-pay | Source: Home / Self Care | Attending: Orthopaedic Surgery

## 2023-07-20 ENCOUNTER — Ambulatory Visit (HOSPITAL_COMMUNITY)

## 2023-07-20 ENCOUNTER — Ambulatory Visit (HOSPITAL_COMMUNITY): Payer: Self-pay | Admitting: Vascular Surgery

## 2023-07-20 ENCOUNTER — Observation Stay (HOSPITAL_COMMUNITY)

## 2023-07-20 DIAGNOSIS — I10 Essential (primary) hypertension: Secondary | ICD-10-CM | POA: Insufficient documentation

## 2023-07-20 DIAGNOSIS — F1090 Alcohol use, unspecified, uncomplicated: Secondary | ICD-10-CM | POA: Diagnosis not present

## 2023-07-20 DIAGNOSIS — Z7982 Long term (current) use of aspirin: Secondary | ICD-10-CM | POA: Insufficient documentation

## 2023-07-20 DIAGNOSIS — M1611 Unilateral primary osteoarthritis, right hip: Secondary | ICD-10-CM | POA: Diagnosis present

## 2023-07-20 DIAGNOSIS — Z96641 Presence of right artificial hip joint: Secondary | ICD-10-CM

## 2023-07-20 HISTORY — PX: TOTAL HIP ARTHROPLASTY: SHX124

## 2023-07-20 LAB — TYPE AND SCREEN
ABO/RH(D): O POS
Antibody Screen: NEGATIVE

## 2023-07-20 SURGERY — ARTHROPLASTY, HIP, TOTAL, ANTERIOR APPROACH
Anesthesia: Spinal | Site: Hip | Laterality: Right

## 2023-07-20 MED ORDER — FENTANYL CITRATE (PF) 250 MCG/5ML IJ SOLN
INTRAMUSCULAR | Status: DC | PRN
  Administered 2023-07-20: 25 ug via INTRAVENOUS

## 2023-07-20 MED ORDER — ALUM & MAG HYDROXIDE-SIMETH 200-200-20 MG/5ML PO SUSP: 30.0000 mL | ORAL | Status: DC | PRN

## 2023-07-20 MED ORDER — SODIUM CHLORIDE 0.9 % IV SOLN: INTRAVENOUS | Status: DC

## 2023-07-20 MED ORDER — LACTATED RINGERS IV SOLN: INTRAVENOUS | Status: DC

## 2023-07-20 MED ORDER — ACETAMINOPHEN 325 MG PO TABS: 325.0000 mg | ORAL_TABLET | Freq: Four times a day (QID) | ORAL | Status: DC | PRN

## 2023-07-20 MED ORDER — ACETAMINOPHEN 500 MG PO TABS
ORAL_TABLET | ORAL | Status: AC
  Administered 2023-07-20: 1000 mg via ORAL
  Filled 2023-07-20: qty 2

## 2023-07-20 MED ORDER — EPHEDRINE 5 MG/ML INJ
INTRAVENOUS | Status: AC
  Filled 2023-07-20: qty 5

## 2023-07-20 MED ORDER — OXYCODONE HCL 5 MG PO TABS
10.0000 mg | ORAL_TABLET | ORAL | Status: DC | PRN
  Administered 2023-07-20 (×2): 10 mg via ORAL

## 2023-07-20 MED ORDER — TRANEXAMIC ACID-NACL 1000-0.7 MG/100ML-% IV SOLN
1000.0000 mg | INTRAVENOUS | Status: AC
  Administered 2023-07-20: 1000 mg via INTRAVENOUS

## 2023-07-20 MED ORDER — ORAL CARE MOUTH RINSE: 15.0000 mL | Freq: Once | OROMUCOSAL | Status: AC

## 2023-07-20 MED ORDER — EPHEDRINE SULFATE-NACL 50-0.9 MG/10ML-% IV SOSY
PREFILLED_SYRINGE | INTRAVENOUS | Status: DC | PRN
  Administered 2023-07-20: 5 mg via INTRAVENOUS

## 2023-07-20 MED ORDER — ONDANSETRON HCL 4 MG PO TABS: 4.0000 mg | ORAL_TABLET | Freq: Four times a day (QID) | ORAL | Status: DC | PRN

## 2023-07-20 MED ORDER — CEFAZOLIN SODIUM-DEXTROSE 2-4 GM/100ML-% IV SOLN
INTRAVENOUS | Status: AC
  Filled 2023-07-20: qty 100

## 2023-07-20 MED ORDER — SODIUM CHLORIDE 0.9 % IR SOLN
Status: DC | PRN
  Administered 2023-07-20: 1000 mL

## 2023-07-20 MED ORDER — HYDROMORPHONE HCL 1 MG/ML IJ SOLN
0.5000 mg | INTRAMUSCULAR | Status: DC | PRN
  Administered 2023-07-20 (×2): 1 mg via INTRAVENOUS
  Filled 2023-07-20 (×2): qty 1

## 2023-07-20 MED ORDER — PROPOFOL 10 MG/ML IV BOLUS
INTRAVENOUS | Status: AC
  Filled 2023-07-20: qty 20

## 2023-07-20 MED ORDER — PHENYLEPHRINE HCL-NACL 20-0.9 MG/250ML-% IV SOLN
INTRAVENOUS | Status: DC | PRN
  Administered 2023-07-20: 35 ug/min via INTRAVENOUS

## 2023-07-20 MED ORDER — MIDAZOLAM HCL 2 MG/2ML IJ SOLN
INTRAMUSCULAR | Status: DC | PRN
  Administered 2023-07-20: 2 mg via INTRAVENOUS

## 2023-07-20 MED ORDER — CHLORHEXIDINE GLUCONATE 0.12 % MT SOLN
OROMUCOSAL | Status: AC
  Administered 2023-07-20: 15 mL via OROMUCOSAL
  Filled 2023-07-20: qty 15

## 2023-07-20 MED ORDER — B-12 5000 MCG SL SUBL: 5000.0000 ug | SUBLINGUAL_TABLET | Freq: Every day | SUBLINGUAL | Status: DC

## 2023-07-20 MED ORDER — METHOCARBAMOL 1000 MG/10ML IJ SOLN: 500.0000 mg | Freq: Four times a day (QID) | INTRAMUSCULAR | Status: DC | PRN

## 2023-07-20 MED ORDER — KETOROLAC TROMETHAMINE 15 MG/ML IJ SOLN
7.5000 mg | Freq: Four times a day (QID) | INTRAMUSCULAR | Status: DC
  Administered 2023-07-20 (×2): 7.5 mg via INTRAVENOUS
  Filled 2023-07-20 (×2): qty 1

## 2023-07-20 MED ORDER — CEFAZOLIN SODIUM-DEXTROSE 2-4 GM/100ML-% IV SOLN
2.0000 g | Freq: Four times a day (QID) | INTRAVENOUS | Status: AC
  Administered 2023-07-20 (×2): 2 g via INTRAVENOUS
  Filled 2023-07-20 (×2): qty 100

## 2023-07-20 MED ORDER — 0.9 % SODIUM CHLORIDE (POUR BTL) OPTIME
TOPICAL | Status: DC | PRN
  Administered 2023-07-20: 1000 mL

## 2023-07-20 MED ORDER — VITAMIN D 25 MCG (1000 UNIT) PO TABS
2000.0000 [IU] | ORAL_TABLET | Freq: Every day | ORAL | Status: DC
  Administered 2023-07-21: 2000 [IU] via ORAL
  Filled 2023-07-20: qty 2

## 2023-07-20 MED ORDER — METOCLOPRAMIDE HCL 5 MG PO TABS: 5.0000 mg | ORAL_TABLET | Freq: Three times a day (TID) | ORAL | Status: DC | PRN

## 2023-07-20 MED ORDER — TRANEXAMIC ACID-NACL 1000-0.7 MG/100ML-% IV SOLN
INTRAVENOUS | Status: AC
  Filled 2023-07-20: qty 100

## 2023-07-20 MED ORDER — ASPIRIN 81 MG PO CHEW
81.0000 mg | CHEWABLE_TABLET | Freq: Two times a day (BID) | ORAL | Status: DC
  Administered 2023-07-20 – 2023-07-21 (×2): 81 mg via ORAL
  Filled 2023-07-20 (×2): qty 1

## 2023-07-20 MED ORDER — GABAPENTIN 100 MG PO CAPS
100.0000 mg | ORAL_CAPSULE | Freq: Every day | ORAL | Status: DC
  Administered 2023-07-20: 100 mg via ORAL
  Filled 2023-07-20: qty 1

## 2023-07-20 MED ORDER — ACETAMINOPHEN 500 MG PO TABS: 1000.0000 mg | ORAL_TABLET | Freq: Once | ORAL | Status: AC

## 2023-07-20 MED ORDER — PANTOPRAZOLE SODIUM 40 MG PO TBEC
40.0000 mg | DELAYED_RELEASE_TABLET | Freq: Every day | ORAL | Status: DC
  Administered 2023-07-20 – 2023-07-21 (×2): 40 mg via ORAL
  Filled 2023-07-20 (×2): qty 1

## 2023-07-20 MED ORDER — PROPOFOL 10 MG/ML IV BOLUS
INTRAVENOUS | Status: DC | PRN
  Administered 2023-07-20: 30 mg via INTRAVENOUS

## 2023-07-20 MED ORDER — ONDANSETRON HCL 4 MG/2ML IJ SOLN
INTRAMUSCULAR | Status: DC | PRN
  Administered 2023-07-20: 4 mg via INTRAVENOUS

## 2023-07-20 MED ORDER — METOCLOPRAMIDE HCL 5 MG/ML IJ SOLN: 5.0000 mg | Freq: Three times a day (TID) | INTRAMUSCULAR | Status: DC | PRN

## 2023-07-20 MED ORDER — MIDAZOLAM HCL 2 MG/2ML IJ SOLN
INTRAMUSCULAR | Status: AC
Start: 2023-07-20 — End: ?
  Filled 2023-07-20: qty 2

## 2023-07-20 MED ORDER — CHLORHEXIDINE GLUCONATE 0.12 % MT SOLN: 15.0000 mL | Freq: Once | OROMUCOSAL | Status: AC

## 2023-07-20 MED ORDER — OXYCODONE HCL 5 MG PO TABS
5.0000 mg | ORAL_TABLET | ORAL | Status: DC | PRN
  Administered 2023-07-20: 10 mg via ORAL
  Administered 2023-07-21: 5 mg via ORAL
  Filled 2023-07-20: qty 1
  Filled 2023-07-20 (×2): qty 2

## 2023-07-20 MED ORDER — PHENOL 1.4 % MT LIQD: 1.0000 | OROMUCOSAL | Status: DC | PRN

## 2023-07-20 MED ORDER — BUPIVACAINE IN DEXTROSE 0.75-8.25 % IT SOLN
INTRATHECAL | Status: DC | PRN
  Administered 2023-07-20: 13.5 mg via INTRATHECAL

## 2023-07-20 MED ORDER — POVIDONE-IODINE 10 % EX SWAB
2.0000 | Freq: Once | CUTANEOUS | Status: AC
Start: 2023-07-20 — End: 2023-07-20
  Administered 2023-07-20: 2 via TOPICAL

## 2023-07-20 MED ORDER — PROPOFOL 500 MG/50ML IV EMUL
INTRAVENOUS | Status: DC | PRN
  Administered 2023-07-20: 135 ug/kg/min via INTRAVENOUS

## 2023-07-20 MED ORDER — MENTHOL 3 MG MT LOZG: 1.0000 | LOZENGE | OROMUCOSAL | Status: DC | PRN

## 2023-07-20 MED ORDER — ONDANSETRON HCL 4 MG/2ML IJ SOLN: 4.0000 mg | Freq: Four times a day (QID) | INTRAMUSCULAR | Status: DC | PRN

## 2023-07-20 MED ORDER — FENTANYL CITRATE (PF) 250 MCG/5ML IJ SOLN
INTRAMUSCULAR | Status: AC
  Filled 2023-07-20: qty 5

## 2023-07-20 MED ORDER — CEFAZOLIN SODIUM-DEXTROSE 2-4 GM/100ML-% IV SOLN
2.0000 g | INTRAVENOUS | Status: AC
  Administered 2023-07-20: 2 g via INTRAVENOUS

## 2023-07-20 MED ORDER — DIPHENHYDRAMINE HCL 12.5 MG/5ML PO ELIX: 12.5000 mg | ORAL_SOLUTION | ORAL | Status: DC | PRN

## 2023-07-20 MED ORDER — DOCUSATE SODIUM 100 MG PO CAPS
100.0000 mg | ORAL_CAPSULE | Freq: Two times a day (BID) | ORAL | Status: DC
  Administered 2023-07-20 – 2023-07-21 (×2): 100 mg via ORAL
  Filled 2023-07-20 (×2): qty 1

## 2023-07-20 MED ORDER — METHOCARBAMOL 500 MG PO TABS
500.0000 mg | ORAL_TABLET | Freq: Four times a day (QID) | ORAL | Status: DC | PRN
  Administered 2023-07-20 – 2023-07-21 (×3): 500 mg via ORAL
  Filled 2023-07-20 (×3): qty 1

## 2023-07-20 SURGICAL SUPPLY — 43 items
BAG COUNTER SPONGE SURGICOUNT (BAG) ×1 IMPLANT
BENZOIN TINCTURE PRP APPL 2/3 (GAUZE/BANDAGES/DRESSINGS) ×1 IMPLANT
BLADE CLIPPER SURG (BLADE) IMPLANT
BLADE SAW SGTL 18X1.27X75 (BLADE) ×1 IMPLANT
COVER SURGICAL LIGHT HANDLE (MISCELLANEOUS) ×1 IMPLANT
DRAPE C-ARM 42X72 X-RAY (DRAPES) ×1 IMPLANT
DRAPE STERI IOBAN 125X83 (DRAPES) ×1 IMPLANT
DRAPE U-SHAPE 47X51 STRL (DRAPES) ×3 IMPLANT
DRSG AQUACEL AG ADV 3.5X10 (GAUZE/BANDAGES/DRESSINGS) ×1 IMPLANT
DURAPREP 26ML APPLICATOR (WOUND CARE) ×1 IMPLANT
ELECT BLADE 6.5 EXT (BLADE) IMPLANT
ELECTRODE BLDE 4.0 EZ CLN MEGD (MISCELLANEOUS) ×1 IMPLANT
ELECTRODE REM PT RTRN 9FT ADLT (ELECTROSURGICAL) ×1 IMPLANT
FACESHIELD WRAPAROUND (MASK) ×2 IMPLANT
FACESHIELD WRAPAROUND OR TEAM (MASK) ×2 IMPLANT
FEMORAL STEM 12/14 TPR SZ4 HIP (Orthopedic Implant) IMPLANT
GLOVE BIOGEL PI IND STRL 8 (GLOVE) ×2 IMPLANT
GLOVE ECLIPSE 8.0 STRL XLNG CF (GLOVE) ×1 IMPLANT
GLOVE ORTHO TXT STRL SZ7.5 (GLOVE) ×2 IMPLANT
GOWN STRL REUS W/ TWL LRG LVL3 (GOWN DISPOSABLE) ×2 IMPLANT
GOWN STRL REUS W/ TWL XL LVL3 (GOWN DISPOSABLE) ×2 IMPLANT
HEAD CERAMIC 36 PLUS5 (Hips) IMPLANT
KIT BASIN OR (CUSTOM PROCEDURE TRAY) ×1 IMPLANT
KIT TURNOVER KIT B (KITS) ×1 IMPLANT
LINER ACETAB NEUTRAL 36ID 520D (Liner) IMPLANT
MANIFOLD NEPTUNE II (INSTRUMENTS) ×1 IMPLANT
NS IRRIG 1000ML POUR BTL (IV SOLUTION) ×1 IMPLANT
PACK TOTAL JOINT (CUSTOM PROCEDURE TRAY) ×1 IMPLANT
PAD ARMBOARD POSITIONER FOAM (MISCELLANEOUS) ×1 IMPLANT
PIN SECTOR W/GRIP ACE CUP 52MM (Hips) IMPLANT
SET HNDPC FAN SPRY TIP SCT (DISPOSABLE) ×1 IMPLANT
STAPLER SKIN PROX 35W (STAPLE) IMPLANT
STRIP CLOSURE SKIN 1/2X4 (GAUZE/BANDAGES/DRESSINGS) ×2 IMPLANT
SUT ETHIBOND NAB CT1 #1 30IN (SUTURE) ×1 IMPLANT
SUT MNCRL AB 4-0 PS2 18 (SUTURE) IMPLANT
SUT VIC AB 0 CT1 27XBRD ANBCTR (SUTURE) ×1 IMPLANT
SUT VIC AB 1 CT1 27XBRD ANBCTR (SUTURE) ×1 IMPLANT
SUT VIC AB 2-0 CT1 TAPERPNT 27 (SUTURE) ×1 IMPLANT
TOWEL GREEN STERILE (TOWEL DISPOSABLE) ×1 IMPLANT
TOWEL GREEN STERILE FF (TOWEL DISPOSABLE) ×1 IMPLANT
TRAY CATH INTERMITTENT SS 16FR (CATHETERS) IMPLANT
TRAY FOLEY W/BAG SLVR 16FR ST (SET/KITS/TRAYS/PACK) IMPLANT
WATER STERILE IRR 1000ML POUR (IV SOLUTION) ×2 IMPLANT

## 2023-07-20 NOTE — Anesthesia Postprocedure Evaluation (Signed)
 Anesthesia Post Note  Patient: Tasha Anderson  Procedure(s) Performed: ARTHROPLASTY, RIGHT HIP, TOTAL, ANTERIOR APPROACH (Right: Hip)     Patient location during evaluation: PACU Anesthesia Type: Spinal Level of consciousness: awake and alert, patient cooperative and oriented Pain management: pain level controlled Vital Signs Assessment: post-procedure vital signs reviewed and stable Respiratory status: nonlabored ventilation, spontaneous breathing and respiratory function stable Cardiovascular status: blood pressure returned to baseline and stable Postop Assessment: no apparent nausea or vomiting, spinal receding and adequate PO intake Anesthetic complications: no   No notable events documented.  Last Vitals:  Vitals:   07/20/23 1030 07/20/23 1055  BP: 107/77 117/87  Pulse: 65 66  Resp: 11 20  Temp:    SpO2: 100% 100%                  Kaitlynd Phillips,E. Haidy Kackley

## 2023-07-20 NOTE — Anesthesia Procedure Notes (Signed)
 Procedure Name: MAC Date/Time: 07/20/2023 7:30 AM  Performed by: Alphia Jasmine, CRNAPre-anesthesia Checklist: Patient identified, Emergency Drugs available, Suction available, Timeout performed and Patient being monitored Patient Re-evaluated:Patient Re-evaluated prior to induction Oxygen Delivery Method: Simple face mask Induction Type: IV induction Dental Injury: Teeth and Oropharynx as per pre-operative assessment

## 2023-07-20 NOTE — Anesthesia Procedure Notes (Signed)
 Spinal  Patient location during procedure: OR End time: 07/20/2023 7:36 AM Reason for block: surgical anesthesia Staffing Performed: anesthesiologist  Anesthesiologist: Jonne Netters, MD Performed by: Jonne Netters, MD Authorized by: Jonne Netters, MD   Preanesthetic Checklist Completed: patient identified, IV checked, site marked, risks and benefits discussed, surgical consent, monitors and equipment checked, pre-op evaluation and timeout performed Spinal Block Patient position: sitting Prep: DuraPrep Patient monitoring: heart rate, cardiac monitor, continuous pulse ox and blood pressure Approach: midline Location: L3-4 Injection technique: single-shot Needle Needle type: Pencan and Introducer  Needle gauge: 24 G Needle length: 9 cm Assessment Sensory level: T4 Events: CSF return Additional Notes Pt identified in Operating room.  Monitors applied. Working IV access confirmed. Sterile prep, drape lumbar spine.  1% lido local L 3,4.  #24ga Pencan into clear CSF L 3,4.  13.5 mg 0.75% Bupivacaine with dextrose injected with asp CSF beginning and end of injection.  Patient asymptomatic, VSS, no heme aspirated, tolerated well.  Fay Hoop, MD

## 2023-07-20 NOTE — Plan of Care (Signed)

## 2023-07-20 NOTE — Op Note (Signed)
 Operative Note  Date of operation: 07/20/2023 Preoperative diagnosis: Right hip primary osteoarthritis Postoperative diagnosis: Same  Procedure: Right direct anterior total hip arthroplasty  Implants: Implant Name Type Inv. Item Serial No. Manufacturer Lot No. LRB No. Used Action  PIN SECTOR W/GRIP ACE CUP - [id number] Hips PIN SECTOR W/GRIP ACE CUP  [employer]PU[employer] [id number] Right 1 Implanted  LINER ACETAB NEUTRAL 36ID 520D - [id number] Liner LINER ACETAB NEUTRAL 36ID 520D  [employer]PU[employer]TH[employer] [id number] Right 1 Implanted  FEMORAL STEM 12/14 TPR SZ4 HIP - [id number] Orthopedic Implant FEMORAL STEM 12/14 TPR SZ4 HIP  DEPUY [employer]TH[employer] [id number] Right 1 Implanted  HEAD CERAMIC 36 PLUS5 - [id number] Hips HEAD CERAMIC 36 PLUS5  DEPU[employer]TH[employer] [id number] Right 1 Implanted   Surgeon: Jeanella Milan. Lucienne Ryder, MD Assistant: Malena Scull, PA-C  Anesthesia: Spinal Antibiotics: IV Ancef EBL: 200 cc Complications: None  Indications: The patient is a 58 year old female with significant arthritis involving her right hip.  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  At this point her right hip pain is daily and it is detrimentally affecting her mobility, her quality of life and her actives daily living.  She has tried and failed conservative treatment for over a year.  We have recommended a total hip arthroplasty and she actually wishes to proceed with this given her significant right hip pain.  We did discuss the risks of acute blood loss anemia, nerve vessel injury, fracture, infection, DVT, dislocation, implant failure, leg length differences and wound healing issues.  She understands that our goals are hopefully decreased pain, improved mobility and improved quality of life.  Procedure description: After informed consent was obtained and the appropriate right hip was marked, the patient was brought to the operating room and set up on the stretcher where spinal anesthesia was  obtained.  She was then laid in a supine position on the stretcher and a Foley catheter was placed.  We assessed her leg lengths and then placed traction boots on both her feet.  Next she was placed supine on the Hana fracture table with a perineal post in place and both legs in inline skeletal traction devices but no traction applied.  Her right operative hip and pelvis were assessed radiographically.  The right hip was prepped and draped with DuraPrep and sterile drapes.  A timeout was called and she was identified as the correct patient the correct right hip.  An incision was then made just inferior and posterior to the ASIS and carried slightly obliquely down the leg.  Dissection was carried down to the tensor fascia lata muscle and the tensor fascia was divided longitudinally to proceed with a direct interposed the hip.  Circumflex vessels were identified and cauterized.  The hip capsule was identified and opened up in L-type format finding a large joint effusion.  Cobra retractors were placed around the medial and lateral femoral neck and a femoral neck cut was made with an oscillating saw proximal to the left trochanter and this cut was completed with an osteotome.  A corkscrew guide was placed in the femoral head and the femoral head was removed in its entirety and there was significant cartilage wear at the weightbearing surface of the femoral head and osteophytes.  We then removed remnants of the acetabular labrum and other debris and placed a bent Hohmann over the medial acetabular rim.  Reaming was then initiated from a size 43 reamer and stepwise increments going up to a size 51 reamer with all reamers  placed under direct visualization and the last reamer also placed under direct fluoroscopy in order to obtain the depth and reaming, the inclination and the anteversion.  The real DePuy sector GRIPTION acetabular clinic size 52 was then placed without difficulty followed by a 36+0 polythene liner.   Attention was then turned to the femur.  With the right leg externally rotated to 120 degrees, extended and adducted, a Mueller retractors placed medially and Hohmann retractor behind the greater trochanter.  The lateral joint capsule was released and a box cutting osteotome was used into the femoral canal.  Broaching was then initiated using the Actis broaching system from a size 0 going to a size 4.  With size 4 in place we trialed a high offset femoral neck and a 36-2 hip ball because I felt like our femoral neck cut was high.  This was reduced in the pelvis and we assessed it radiographically and clinically and we knew we needed to increase her leg length.  We dislocated the hip remove the trial components.  We placed the real Actis femoral component size 4 with high offset and with the real 36+5 ceramic head ball.  Again this was reduced in the acetabulum was felt to be tight.  Clinically it was stable and radiographic assessment we felt like we had done well on her offset and leg length.  We then irrigated the tissue with normal saline solution using pulsatile lavage.  The joint capsule was closed with interrupted #1 Ethibond suture followed by normal and Vicryl to close the tensor fascia.  0 Vicryl was used to close deep tissue and 2-0 Vicryl was used to close the subcutaneous tissue.  The skin was closed with staples.  An Aquacel dressing was applied.  The patient was taken off the Hana table and taken the recovery room.  Malena Scull, PA-C did assist during the entire case and beginning the end and his assistance was crucial and medically necessary for soft tissue management and retraction, helping guide implant placement and a layered closure of the wound.

## 2023-07-20 NOTE — Interval H&P Note (Signed)
 History and Physical Interval Note: The patient understands that she is here today for a right hip replacement to treat her significant right hip pain and arthritis.  There has been no acute or interval change in her medical status.  The risks and benefits of surgery have been discussed in detail and informed consent has been obtained.  The right operative hip has been marked.  07/20/2023 7:20 AM  Tasha Anderson  has presented today for surgery, with the diagnosis of osteoarthritis right hip.  The various methods of treatment have been discussed with the patient and family. After consideration of risks, benefits and other options for treatment, the patient has consented to  Procedure(s): ARTHROPLASTY, HIP, TOTAL, ANTERIOR APPROACH (Right) as a surgical intervention.  The patient's history has been reviewed, patient examined, no change in status, stable for surgery.  I have reviewed the patient's chart and labs.  Questions were answered to the patient's satisfaction.     Arnie Lao

## 2023-07-20 NOTE — Transfer of Care (Signed)
 Immediate Anesthesia Transfer of Care Note  Patient: Tasha Anderson  Procedure(s) Performed: ARTHROPLASTY, RIGHT HIP, TOTAL, ANTERIOR APPROACH (Right: Hip)  Patient Location: PACU  Anesthesia Type:MAC and Spinal  Level of Consciousness: awake, alert , and oriented  Airway & Oxygen Therapy: Patient Spontanous Breathing  Post-op Assessment: Report given to RN and Post -op Vital signs reviewed and stable  Post vital signs: Reviewed and stable  Last Vitals:  Vitals Value Taken Time  BP 95/57 07/20/23 0900  Temp    Pulse 84 07/20/23 0901  Resp 12 07/20/23 0901  SpO2 98 % 07/20/23 0901  Vitals shown include unfiled device data.  Last Pain:  Vitals:   07/20/23 0621  TempSrc:   PainSc: 2          Complications: No notable events documented.

## 2023-07-20 NOTE — Discharge Instructions (Signed)
 Per [facility] clinic policy, our goal is ensure optimal postoperative pain control with a multimodal pain management strategy. For all [facility] patients, our goal is to wean post-operative narcotic medications by 6 weeks post-operatively. If this is not possible due to utilization of pain medication prior to surgery, your [facility] doctor will support your acute post-operative pain control for the first 6 weeks postoperatively, with a plan to transition you back to your primary pain team following that. Tasha Anderson will work to ensure a Therapist, occupational.  INSTRUCTIONS AFTER JOINT REPLACEMENT   Remove items at home which could result in a fall. This includes throw rugs or furniture in walking pathways ICE to the affected joint every three hours while awake for 30 minutes at a time, for at least the first 3-5 days, and then as needed for pain and swelling.  Continue to use ice for pain and swelling. You may notice swelling that will progress down to the foot and ankle.  This is normal after surgery.  Elevate your leg when you are not up walking on it.   Continue to use the breathing machine you got in the hospital (incentive spirometer) which will help keep your temperature down.  It is common for your temperature to cycle up and down following surgery, especially at night when you are not up moving around and exerting yourself.  The breathing machine keeps your lungs expanded and your temperature down.   DIET:  As you were doing prior to hospitalization, we recommend a well-balanced diet.  DRESSING / WOUND CARE / SHOWERING  Keep the surgical dressing until follow up.  The dressing is water proof, so you can shower without any extra covering.  IF THE DRESSING FALLS OFF or the wound gets wet inside, change the dressing with sterile gauze.  Please use good hand washing techniques before changing the dressing.  Do not use any lotions or creams on the incision until instructed by your surgeon.     ACTIVITY  Increase activity slowly as tolerated, but follow the weight bearing instructions below.   No driving for 6 weeks or until further direction given by your physician.  You cannot drive while taking narcotics.  No lifting or carrying greater than 10 lbs. until further directed by your surgeon. Avoid periods of inactivity such as sitting longer than an hour when not asleep. This helps prevent blood clots.  You may return to work once you are authorized by your doctor.     WEIGHT BEARING   Weight bearing as tolerated with assist device (walker, cane, etc) as directed, use it as long as suggested by your surgeon or therapist, typically at least 4-6 weeks.   EXERCISES  Results after joint replacement surgery are often greatly improved when you follow the exercise, range of motion and muscle strengthening exercises prescribed by your doctor. Safety measures are also important to protect the joint from further injury. Any time any of these exercises cause you to have increased pain or swelling, decrease what you are doing until you are comfortable again and then slowly increase them. If you have problems or questions, call your caregiver or physical therapist for advice.   Rehabilitation is important following a joint replacement. After just a few days of immobilization, the muscles of the leg can become weakened and shrink (atrophy).  These exercises are designed to build up the tone and strength of the thigh and leg muscles and to improve motion. Often times heat used for twenty to thirty minutes before  working out will loosen up your tissues and help with improving the range of motion but do not use heat for the first two weeks following surgery (sometimes heat can increase post-operative swelling).   These exercises can be done on a training (exercise) mat, on the floor, on a table or on a bed. Use whatever works the best and is most comfortable for you.    Use music or television  while you are exercising so that the exercises are a pleasant break in your day. This will make your life better with the exercises acting as a break in your routine that you can look forward to.   Perform all exercises about fifteen times, three times per day or as directed.  You should exercise both the operative leg and the other leg as well.  Exercises include:   Quad Sets - Tighten up the muscle on the front of the thigh (Quad) and hold for 5-10 seconds.   Straight Leg Raises - With your knee straight (if you were given a brace, keep it on), lift the leg to 60 degrees, hold for 3 seconds, and slowly lower the leg.  Perform this exercise against resistance later as your leg gets stronger.  Leg Slides: Lying on your back, slowly slide your foot toward your buttocks, bending your knee up off the floor (only go as far as is comfortable). Then slowly slide your foot back down until your leg is flat on the floor again.  Angel Wings: Lying on your back spread your legs to the side as far apart as you can without causing discomfort.  Hamstring Strength:  Lying on your back, push your heel against the floor with your leg straight by tightening up the muscles of your buttocks.  Repeat, but this time bend your knee to a comfortable angle, and push your heel against the floor.  You may put a pillow under the heel to make it more comfortable if necessary.   A rehabilitation program following joint replacement surgery can speed recovery and prevent re-injury in the future due to weakened muscles. Contact your doctor or a physical therapist for more information on knee rehabilitation.    CONSTIPATION  Constipation is defined medically as fewer than three stools per week and severe constipation as less than one stool per week.  Even if you have a regular bowel pattern at home, your normal regimen is likely to be disrupted due to multiple reasons following surgery.  Combination of anesthesia, postoperative  narcotics, change in appetite and fluid intake all can affect your bowels.   YOU MUST use at least one of the following options; they are listed in order of increasing strength to get the job done.  They are all available over the counter, and you may need to use some, POSSIBLY even all of these options:    Drink plenty of fluids (prune juice may be helpful) and high fiber foods Colace 100 mg by mouth twice a day  Senokot for constipation as directed and as needed Dulcolax (bisacodyl), take with full glass of water  Miralax (polyethylene glycol) once or twice a day as needed.  If you have tried all these things and are unable to have a bowel movement in the first 3-4 days after surgery call either your surgeon or your primary doctor.    If you experience loose stools or diarrhea, hold the medications until you stool forms back up.  If your symptoms do not get better within 1 week  or if they get worse, check with your doctor.  If you experience "the worst abdominal pain ever" or develop nausea or vomiting, please contact the office immediately for further recommendations for treatment.   ITCHING:  If you experience itching with your medications, try taking only a single pain pill, or even half a pain pill at a time.  You can also use Benadryl over the counter for itching or also to help with sleep.   TED HOSE STOCKINGS:  Use stockings on both legs until for at least 2 weeks or as directed by physician office. They may be removed at night for sleeping.  MEDICATIONS:  See your medication summary on the "After Visit Summary" that nursing will review with you.  You may have some home medications which will be placed on hold until you complete the course of blood thinner medication.  It is important for you to complete the blood thinner medication as prescribed.  PRECAUTIONS:  If you experience chest pain or shortness of breath - call 911 immediately for transfer to the hospital emergency department.    If you develop a fever greater that 101 F, purulent drainage from wound, increased redness or drainage from wound, foul odor from the wound/dressing, or calf pain - CONTACT YOUR SURGEON.                                                   FOLLOW-UP APPOINTMENTS:  If you do not already have a post-op appointment, please call the office for an appointment to be seen by your surgeon.  Guidelines for how soon to be seen are listed in your "After Visit Summary", but are typically between 1-4 weeks after surgery.  OTHER INSTRUCTIONS:   Knee Replacement:  Do not place pillow under knee, focus on keeping the knee straight while resting. CPM instructions: 0-90 degrees, 2 hours in the morning, 2 hours in the afternoon, and 2 hours in the evening. Place foam block, curve side up under heel at all times except when in CPM or when walking.  DO NOT modify, tear, cut, or change the foam block in any way.  POST-OPERATIVE OPIOID TAPER INSTRUCTIONS: It is important to wean off of your opioid medication as soon as possible. If you do not need pain medication after your surgery it is ok to stop day one. Opioids include: Codeine, Hydrocodone(Norco, Vicodin), Oxycodone(Percocet, oxycontin) and hydromorphone amongst others.  Long term and even short term use of opiods can cause: Increased pain response Dependence Constipation Depression Respiratory depression And more.  Withdrawal symptoms can include Flu like symptoms Nausea, vomiting And more Techniques to manage these symptoms Hydrate well Eat regular healthy meals Stay active Use relaxation techniques(deep breathing, meditating, yoga) Do Not substitute Alcohol to help with tapering If you have been on opioids for less than two weeks and do not have pain than it is ok to stop all together.  Plan to wean off of opioids This plan should start within one week post op of your joint replacement. Maintain the same interval or time between taking each dose  and first decrease the dose.  Cut the total daily intake of opioids by one tablet each day Next start to increase the time between doses. The last dose that should be eliminated is the evening dose.   MAKE SURE YOU:  Understand these instructions.  Get help right away if you are not doing well or get worse.    Thank you for letting us be a part of your medical care team.  It is a privilege we respect greatly.  We hope these instructions will help you stay on track for a fast and full recovery!      Dental Antibiotics:  In most cases prophylactic antibiotics for Dental procdeures after total joint surgery are not necessary.  Exceptions are as follows:  1. History of prior total joint infection  2. Severely immunocompromised (Organ Transplant, cancer chemotherapy, Rheumatoid biologic meds such as Humera)  3. Poorly controlled diabetes (A1C &gt; 8.0, blood glucose over 200)  If you have one of these conditions, contact your surgeon for an antibiotic prescription, prior to your dental procedure.

## 2023-07-20 NOTE — Evaluation (Signed)
 Physical Therapy Evaluation Patient Details Name: Tasha Anderson MRN: [id number] DOB: [birth year] Today's Date: 07/20/2023  History of Present Illness  58 y.o. female presents to [facility] hospital on 07/20/2023 for elective R THA. PMH includes Basal cell carcinoma, migraines.  Clinical Impression  Pt presents to PT with deficits in functional mobility, gait, balance, strength, ROM. Pt is able to ambulate for short household distances with support of the RW, limited by reports of dizziness. PT provides education on the THA exercise packet and encourages frequent mobilization with staff assistance. PT will follow up tomorrow for a progression of gait and to initiate stair training.          If plan is discharge home, recommend the following: A little help with bathing/dressing/bathroom;Assistance with cooking/housework;Assist for transportation;Help with stairs or ramp for entrance   Can travel by private vehicle        Equipment Recommendations Rolling walker (2 wheels)  Recommendations for Other Services       Functional Status Assessment Patient has had a recent decline in their functional status and demonstrates the ability to make significant improvements in function in a reasonable and predictable amount of time.     Precautions / Restrictions Precautions Precautions: Fall Recall of Precautions/Restrictions: Intact Precaution/Restrictions Comments: direct anterior THA Restrictions Weight Bearing Restrictions Per Provider Order: Yes RLE Weight Bearing Per Provider Order: Weight bearing as tolerated      Mobility  Bed Mobility Overal bed mobility: Needs Assistance Bed Mobility: Supine to Sit, Sit to Supine     Supine to sit: Supervision Sit to supine: Contact guard assist        Transfers Overall transfer level: Needs assistance Equipment used: Rolling walker (2 wheels) Transfers: Sit to/from Stand Sit to Stand: Contact guard assist                 Ambulation/Gait Ambulation/Gait assistance: Contact guard assist Gait Distance (Feet): 70 Feet Assistive device: Rolling walker (2 wheels) Gait Pattern/deviations: Step-through pattern Gait velocity: reduced Gait velocity interpretation: <1.8 ft/sec, indicate of risk for recurrent falls   General Gait Details: slowed step-through gait, reduced stance time on RLE  Stairs            Wheelchair Mobility     Tilt Bed    Modified Rankin (Stroke Patients Only)       Balance Overall balance assessment: Needs assistance Sitting-balance support: No upper extremity supported, Feet supported Sitting balance-Leahy Scale: Good     Standing balance support: Single extremity supported, Reliant on assistive device for balance Standing balance-Leahy Scale: Poor                               Pertinent Vitals/Pain Pain Assessment Pain Assessment: 0-10 Pain Score: 3  Pain Location: R thigh incision Pain Descriptors / Indicators: Burning Pain Intervention(s): Monitored during session    Home Living Family/patient expects to be discharged to:: Private residence Living Arrangements: Children Available Help at Discharge: Friend(s);Available PRN/intermittently;Family Type of Home: House Home Access: Stairs to enter Entrance Stairs-Rails: Doctor, general practice of Steps: 4   Home Layout: One level Home Equipment: Cane - single point;Toilet riser;Adaptive equipment      Prior Function Prior Level of Function : Independent/Modified Independent;Working/employed;Driving             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       Extremity/Trunk Assessment   Upper Extremity Assessment Upper Extremity Assessment:  Overall WFL for tasks assessed    Lower Extremity Assessment Lower Extremity Assessment: RLE deficits/detail RLE Deficits / Details: generalized post-op weakness, RLE ROM appears functional although not formally  assessed RLE Sensation: decreased light touch    Cervical / Trunk Assessment Cervical / Trunk Assessment: Normal  Communication   Communication Communication: No apparent difficulties    Cognition Arousal: Alert Behavior During Therapy: WFL for tasks assessed/performed   PT - Cognitive impairments: No apparent impairments                         Following commands: Intact       Cueing Cueing Techniques: Verbal cues     General Comments General comments (skin integrity, edema, etc.): pt reports lightheadedness/dizziness when ambulating in hallway. pt is able to return to bed. BP checked shortly after in supine, 138/76.    Exercises Other Exercises Other Exercises: PT provides education on surgical hip exercise packet   Assessment/Plan    PT Assessment Patient needs continued PT services  PT Problem List Decreased strength;Decreased activity tolerance;Decreased balance;Decreased range of motion;Decreased mobility;Pain       PT Treatment Interventions DME instruction;Gait training;Stair training;Functional mobility training;Therapeutic activities;Therapeutic exercise;Balance training;Neuromuscular re-education;Patient/family education    PT Goals (Current goals can be found in the Care Plan section)  Acute Rehab PT Goals Patient Stated Goal: to return to walking her yellow lab PT Goal Formulation: With patient Time For Goal Achievement: 08/03/23 Potential to Achieve Goals: Good    Frequency 7X/week     Co-evaluation               AM-PAC PT "6 Clicks" Mobility  Outcome Measure Help needed turning from your back to your side while in a flat bed without using bedrails?: A Little Help needed moving from lying on your back to sitting on the side of a flat bed without using bedrails?: A Little Help needed moving to and from a bed to a chair (including a wheelchair)?: A Little Help needed standing up from a chair using your arms (e.g., wheelchair or  bedside chair)?: A Little Help needed to walk in hospital room?: A Little Help needed climbing 3-5 steps with a railing? : A Lot 6 Click Score: 17    End of Session Equipment Utilized During Treatment: Gait belt Activity Tolerance: Treatment limited secondary to medical complications (Comment) (limited slightly by dizziness when mobilizing) Patient left: in bed;with call bell/phone within reach Nurse Communication: Mobility status PT Visit Diagnosis: Other abnormalities of gait and mobility (R26.89);Muscle weakness (generalized) (M62.81);Pain Pain - Right/Left: Right Pain - part of body: Hip    Time: 1217-1241 PT Time Calculation (min) (ACUTE ONLY): 24 min   Charges:   PT Evaluation $PT Eval Low Complexity: 1 Low   PT General Charges $$ ACUTE PT VISIT: 1 Visit         Rexie Catena, PT, DPT Acute Rehabilitation Office [phone]   Rexie Catena 07/20/2023, 1:35 PM

## 2023-07-21 ENCOUNTER — Other Ambulatory Visit (HOSPITAL_COMMUNITY): Payer: Self-pay

## 2023-07-21 ENCOUNTER — Encounter (HOSPITAL_COMMUNITY): Payer: Self-pay | Admitting: Orthopaedic Surgery

## 2023-07-21 DIAGNOSIS — M1611 Unilateral primary osteoarthritis, right hip: Secondary | ICD-10-CM | POA: Diagnosis not present

## 2023-07-21 MED ORDER — OXYCODONE HCL 5 MG PO TABS
5.0000 mg | ORAL_TABLET | Freq: Four times a day (QID) | ORAL | 0 refills | Status: AC | PRN
  Filled 2023-07-21: qty 30, 5d supply, fill #0

## 2023-07-21 MED ORDER — METHOCARBAMOL 500 MG PO TABS
500.0000 mg | ORAL_TABLET | Freq: Four times a day (QID) | ORAL | 1 refills | Status: AC | PRN
  Filled 2023-07-21: qty 30, 8d supply, fill #0

## 2023-07-21 MED ORDER — ASPIRIN 81 MG PO CHEW
81.0000 mg | CHEWABLE_TABLET | Freq: Two times a day (BID) | ORAL | 0 refills | Status: AC
  Filled 2023-07-21: qty 30, 15d supply, fill #0

## 2023-07-21 NOTE — Progress Notes (Signed)
 Subjective: 1 Day Post-Op Procedure(s) (LRB): ARTHROPLASTY, RIGHT HIP, TOTAL, ANTERIOR APPROACH (Right) Patient reports pain as moderate.    Objective: Vital signs in last 24 hours: Temp:  [97.6 F (36.4 C)-99.6 F (37.6 C)] 99 F (37.2 C) (06/11 0734) Pulse Rate:  [57-90] 83 (06/11 0734) Resp:  [8-20] 16 (06/11 0734) BP: (95-133)/(57-87) 133/71 (06/11 0734) SpO2:  [96 %-100 %] 100 % (06/11 0734)  Intake/Output from previous day: 06/10 0701 - 06/11 0700 In: 2300 [I.V.:2200; IV Piggyback:100] Out: 1750 [Urine:1550; Blood:200] Intake/Output this shift: No intake/output data recorded.  No results for input(s): HGB in the last 72 hours. No results for input(s): WBC, RBC, HCT, PLT in the last 72 hours. No results for input(s): NA, K, CL, CO2, BUN, CREATININE, GLUCOSE, CALCIUM in the last 72 hours. No results for input(s): LABPT, INR in the last 72 hours.  Sensation intact distally Intact pulses distally Dorsiflexion/Plantar flexion intact Incision: scant drainage   Assessment/Plan: 1 Day Post-Op Procedure(s) (LRB): ARTHROPLASTY, RIGHT HIP, TOTAL, ANTERIOR APPROACH (Right) Up with therapy Discharge home with home health      Arnie Lao 07/21/2023, 7:43 AM

## 2023-07-21 NOTE — Plan of Care (Signed)
   Problem: Education: Goal: Knowledge of General Education information will improve Description: Including pain rating scale, medication(s)/side effects and non-pharmacologic comfort measures Outcome: Completed/Met   Problem: Health Behavior/Discharge Planning: Goal: Ability to manage health-related needs will improve Outcome: Completed/Met   Problem: Clinical Measurements: Goal: Ability to maintain clinical measurements within normal limits will improve Outcome: Completed/Met Goal: Will remain free from infection Outcome: Completed/Met Goal: Diagnostic test results will improve Outcome: Completed/Met Goal: Respiratory complications will improve Outcome: Completed/Met Goal: Cardiovascular complication will be avoided Outcome: Completed/Met   Problem: Activity: Goal: Risk for activity intolerance will decrease Outcome: Completed/Met   Problem: Nutrition: Goal: Adequate nutrition will be maintained Outcome: Completed/Met   Problem: Coping: Goal: Level of anxiety will decrease Outcome: Completed/Met   Problem: Elimination: Goal: Will not experience complications related to bowel motility Outcome: Completed/Met Goal: Will not experience complications related to urinary retention Outcome: Completed/Met   Problem: Pain Managment: Goal: General experience of comfort will improve and/or be controlled Outcome: Completed/Met   Problem: Safety: Goal: Ability to remain free from injury will improve Outcome: Completed/Met   Problem: Skin Integrity: Goal: Risk for impaired skin integrity will decrease Outcome: Completed/Met   Problem: Education: Goal: Knowledge of the prescribed therapeutic regimen will improve Outcome: Completed/Met Goal: Understanding of discharge needs will improve Outcome: Completed/Met Goal: Individualized Educational Video(s) Outcome: Completed/Met   Problem: Activity: Goal: Ability to avoid complications of mobility impairment will  improve Outcome: Completed/Met Goal: Ability to tolerate increased activity will improve Outcome: Completed/Met   Problem: Clinical Measurements: Goal: Postoperative complications will be avoided or minimized Outcome: Completed/Met   Problem: Pain Management: Goal: Pain level will decrease with appropriate interventions Outcome: Completed/Met   Problem: Skin Integrity: Goal: Will show signs of wound healing Outcome: Completed/Met

## 2023-07-21 NOTE — Progress Notes (Signed)
 Physical Therapy Treatment  Patient Details Name: Tasha Anderson MRN: [id number] DOB: 08/20/1965 Today's Date: 07/21/2023   History of Present Illness 58 y.o. female presents to [facility] hospital on 07/20/2023 for elective R THA. PMH includes Basal cell carcinoma, migraines.    PT Comments  Pt progressing towards physical therapy goals. Was able to perform transfers with up to mod I and ambulation with supervision for safety and RW for support. Pt educated on HEP, car transfer, stair negotiation, and activity progression. Will continue to follow, however pt anticipates d/c home this morning.    If plan is discharge home, recommend the following: A little help with bathing/dressing/bathroom;Assistance with cooking/housework;Assist for transportation;Help with stairs or ramp for entrance   Can travel by private vehicle        Equipment Recommendations  Rolling walker (2 wheels)    Recommendations for Other Services       Precautions / Restrictions Precautions Precautions: Fall Recall of Precautions/Restrictions: Intact Precaution/Restrictions Comments: direct anterior THA Restrictions Weight Bearing Restrictions Per Provider Order: Yes RLE Weight Bearing Per Provider Order: Weight bearing as tolerated     Mobility  Bed Mobility Overal bed mobility: Modified Independent Bed Mobility: Supine to Sit           General bed mobility comments: No assist required. Increased time but no use of rails.    Transfers Overall transfer level: Needs assistance Equipment used: Rolling walker (2 wheels) Transfers: Sit to/from Stand Sit to Stand: Supervision           General transfer comment: Pt demonstrated proper hand placement on seated surface for safety. No assist required.    Ambulation/Gait Ambulation/Gait assistance: Supervision Gait Distance (Feet): 450 Feet Assistive device: Rolling walker (2 wheels) Gait Pattern/deviations: Step-through pattern, Decreased dorsiflexion -  right Gait velocity: Decreased Gait velocity interpretation: 1.31 - 2.62 ft/sec, indicative of limited community ambulator   General Gait Details: VC's for improved heel strike. With corrective changes, step/stride length improved as well. Good step-through gait pattern. Noted mild internal rotation of RLE that pt could correct with cues but unable to maintain.   Stairs Stairs: Yes Stairs assistance: Supervision; Risk analyst Stair Management: One rail Right, Step to pattern, Forwards, Sideways Number of Stairs: 5 General stair comments: Pt diagonal holding to rail with B UE's. Safe with stair negotiation.   Wheelchair Mobility     Tilt Bed    Modified Rankin (Stroke Patients Only)       Balance Overall balance assessment: Needs assistance Sitting-balance support: No upper extremity supported, Feet supported Sitting balance-Leahy Scale: Good     Standing balance support: Single extremity supported, Reliant on assistive device for balance Standing balance-Leahy Scale: Poor                              Communication Communication Communication: No apparent difficulties  Cognition Arousal: Alert Behavior During Therapy: WFL for tasks assessed/performed   PT - Cognitive impairments: No apparent impairments                         Following commands: Intact      Cueing Cueing Techniques: Verbal cues  Exercises Total Joint Exercises Ankle Circles/Pumps: 10 reps Quad Sets: 10 reps Short Arc Quad: 10 reps Heel Slides: 10 reps Hip ABduction/ADduction: 10 reps Long Arc Quad: 10 reps    General Comments        Pertinent Vitals/Pain Pain  Assessment Pain Assessment: Faces Faces Pain Scale: Hurts little more Pain Location: R thigh incision Pain Descriptors / Indicators: Operative site guarding, Sore Pain Intervention(s): Limited activity within patient's tolerance, Monitored during session, Repositioned    Home Living                           Prior Function            PT Goals (current goals can now be found in the care plan section) Acute Rehab PT Goals Patient Stated Goal: to return to walking her yellow lab PT Goal Formulation: With patient Time For Goal Achievement: 08/03/23 Potential to Achieve Goals: Good Progress towards PT goals: Progressing toward goals    Frequency    7X/week      PT Plan      Co-evaluation              AM-PAC PT 6 Clicks Mobility   Outcome Measure  Help needed turning from your back to your side while in a flat bed without using bedrails?: A Little Help needed moving from lying on your back to sitting on the side of a flat bed without using bedrails?: A Little Help needed moving to and from a bed to a chair (including a wheelchair)?: A Little Help needed standing up from a chair using your arms (e.g., wheelchair or bedside chair)?: A Little Help needed to walk in hospital room?: A Little Help needed climbing 3-5 steps with a railing? : A Lot 6 Click Score: 17    End of Session Equipment Utilized During Treatment: Gait belt Activity Tolerance: Patient tolerated treatment well Patient left: in chair;with call bell/phone within reach Nurse Communication: Mobility status PT Visit Diagnosis: Other abnormalities of gait and mobility (R26.89);Muscle weakness (generalized) (M62.81);Pain Pain - Right/Left: Right Pain - part of body: Hip     Time: 4098-1191 PT Time Calculation (min) (ACUTE ONLY): 33 min  Charges:    $Gait Training: 8-22 mins $Therapeutic Exercise: 8-22 mins PT General Charges $$ ACUTE PT VISIT: 1 Visit                     Simone Dubois, PT, DPT Acute Rehabilitation Services Secure Chat Preferred Office: [phone]    Tasha Anderson 07/21/2023, 9:44 AM

## 2023-07-21 NOTE — Progress Notes (Signed)
 Patient alert and oriented, mae's well, voiding adequate amount of urine, swallowing without difficulty, no c/o pain at time of discharge. Patient discharged home with family. Script and discharged instructions given to patient. Patient and family stated understanding of instructions given. Room was checked and accounted for all patient's belongings; discharge instructions concerning his medications, incision care, follow up appointment and when to call the doctor as needed were all discussed with patient by RN and she expressed understanding on the instructions given

## 2023-07-21 NOTE — Discharge Summary (Signed)
 Patient ID: Tasha Anderson MRN: [id number] DOB/AGE: 58-Aug-1967 58 y.o.  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 female who was admitted 07/20/2023 for operative treatment ofUnilateral primary osteoarthritis, right hip. Patient has severe unremitting pain that affects sleep, daily activities, and work/hobbies. After pre-op clearance the patient was taken to the operating room on 07/20/2023 and underwent  Procedure(s): ARTHROPLASTY, RIGHT HIP, TOTAL, ANTERIOR APPROACH.    Patient was given perioperative antibiotics:  Anti-infectives (From admission, onward)    Start     Dose/Rate Route Frequency Ordered Stop   07/20/23 1330  ceFAZolin (ANCEF) IVPB 2g/100 mL premix        2 g 200 mL/hr over 30 Minutes Intravenous Every 6 hours 07/20/23 1031 07/21/23 1052   07/20/23 0609  ceFAZolin (ANCEF) 2-4 GM/100ML-% IVPB       Note to Pharmacy: Darrell Else D: cabinet override      07/20/23 0609 07/20/23 0801   07/20/23 0600  ceFAZolin (ANCEF) IVPB 2g/100 mL premix        2 g 200 mL/hr over 30 Minutes Intravenous On call to O.R. 07/20/23 4098 07/20/23 1191        Patient was given sequential compression devices, early ambulation, and chemoprophylaxis to prevent DVT.  Patient benefited maximally from hospital stay and there were no complications.    Recent  vital signs: Patient Vitals for the past 24 hrs:  BP Temp Temp src Pulse Resp SpO2  07/21/23 0734 133/71 99 F (37.2 C) Oral 83 16 100 %  07/21/23 0429 105/62 99.6 F (37.6 C) Oral 90 18 99 %  07/20/23 2347 111/64 98.9 F (37.2 C) Oral 79 18 100 %  07/20/23 1949 119/79 98.4 F (36.9 C) Oral 78 18 100 %  07/20/23 1728 119/83 98.6 F (37 C) Oral 73 16 96 %     Recent laboratory studies: No results for input(s): WBC, HGB, HCT, PLT, NA, K, CL, CO2, BUN, CREATININE, GLUCOSE, INR, CALCIUM in the last 72 hours.  Invalid input(s): PT, 2   Discharge Medications:   Allergies as of 07/21/2023   No Known Allergies      Medication List     TAKE these medications    Aspirin Low Dose 81 MG chewable tablet Generic drug: aspirin Chew 1 tablet (81 mg total) by mouth 2 (two) times daily.   B-12 5000 MCG Subl Place 5,000 mcg under the tongue daily.   FISH OIL PO Take 1,200 mg by mouth daily.   gabapentin 100 MG capsule Commonly known as: NEURONTIN Take 100 mg by mouth at bedtime.   ibuprofen 200 MG tablet Commonly known as: ADVIL Take 200 mg by mouth every 8 (eight) hours as needed for moderate pain (pain score 4-6).   lisinopril 20 MG tablet Commonly known as: ZESTRIL Take 20 mg by mouth daily.  loratadine 10 MG tablet Commonly known as: CLARITIN Take 10 mg by mouth daily.   methocarbamol 500 MG tablet Commonly known as: ROBAXIN Take 1 tablet (500 mg total) by mouth every 6 (six) hours as needed for muscle spasms.   oxyCODONE 5 MG immediate release tablet Commonly known as: Oxy IR/ROXICODONE Take 1-2 tablets (5-10 mg total) by mouth every 6 (six) hours as needed for moderate pain (pain score 4-6) (pain score 4-6).   triamcinolone cream 0.1 % Commonly known as: KENALOG Apply 1 Application topically 2 (two) times daily as needed (irritation).   Vitamin D3 50 MCG (2000 UT) capsule Take 2,000 Units by mouth daily.                Durable Medical Equipment  (From admission, onward)           Start     Ordered   07/20/23 1032  DME 3 n 1  Once        07/20/23 1031   07/20/23 1032  DME Walker rolling  Once       Question Answer Comment  Walker: With 5 Inch Wheels   Patient needs a walker to treat with the following condition Status post total replacement of right hip      07/20/23 1031            Diagnostic Studies: DG Pelvis Portable Result Date: 07/20/2023 CLINICAL DATA:  Status post right hip replacement. EXAM: PORTABLE PELVIS 1-2 VIEWS COMPARISON:  None Available. FINDINGS: Right acetabular and femoral components are well situated. Expected postoperative changes seen in the surrounding soft tissues. IMPRESSION: Status post right total hip arthroplasty. Electronically Signed   By: Rosalene Colon M.D.   On: 07/20/2023 09:59   DG HIP UNILAT WITH PELVIS 1V RIGHT Result Date: 07/20/2023 CLINICAL DATA:  Right total hip arthroplasty. EXAM: DG HIP (WITH OR WITHOUT PELVIS) 1V RIGHT; DG C-ARM 1-60 MIN-NO REPORT Radiation exposure index: 2.5303 mGy. COMPARISON:  March 01, 2023. FINDINGS: Three intraoperative fluoroscopic images were obtained of the right hip. The acetabular and femoral components are well situated. IMPRESSION: Fluoroscopic guidance provided during right total hip arthroplasty. Electronically Signed   By: Rosalene Colon M.D.   On: 07/20/2023 09:14   DG C-Arm 1-60 Min-No Report Result Date: 07/20/2023 CLINICAL DATA:  Right total hip arthroplasty. EXAM: DG HIP (WITH OR WITHOUT PELVIS) 1V RIGHT; DG C-ARM 1-60 MIN-NO REPORT Radiation exposure index: 2.5303 mGy. COMPARISON:  March 01, 2023. FINDINGS: Three intraoperative fluoroscopic images were obtained of the right hip. The acetabular and femoral components are well situated. IMPRESSION: Fluoroscopic guidance provided during right total hip arthroplasty. Electronically Signed   By: Rosalene Colon M.D.   On: 07/20/2023 09:14    Disposition:  Discharge disposition: 01-Home or Self Care          Follow-up Information     [facility] Follow up.   Why: [facility] will contact you for the first home visit. Contact information: [phone]        Arnie Lao, MD Follow up in 2 week(s).   Specialty: Orthopedic Surgery Contact information: [address] [place] [address] [phone]                  Signed: Arnie Lao 07/21/2023, 11:05 AM

## 2023-08-02 ENCOUNTER — Encounter: Payer: Self-pay | Admitting: Orthopaedic Surgery

## 2023-08-02 ENCOUNTER — Ambulatory Visit (INDEPENDENT_AMBULATORY_CARE_PROVIDER_SITE_OTHER): Admitting: Orthopaedic Surgery

## 2023-08-02 DIAGNOSIS — Z96641 Presence of right artificial hip joint: Secondary | ICD-10-CM

## 2023-08-02 NOTE — Progress Notes (Signed)
 The patient is a 58 year old female who is at her 2-week postoperative visit status post a right total hip replacement.  She did drive today.  She is ambulate with a cane.  She reports right knee pain and numbness but otherwise is doing well.  Her incision looks good.  Staples were removed and Steri-Strips applied.  There is some swelling there but is not truly a seroma and is more of a hematoma and I did try to aspirate this.  Her leg lengths feel equal when she is lying supine.  She will slowly increase her activities as comfort allows.  She can stop her baby aspirin  twice daily and we will see her back in 4 weeks to see how she is doing overall but no x-rays are needed.

## 2023-08-30 ENCOUNTER — Ambulatory Visit (INDEPENDENT_AMBULATORY_CARE_PROVIDER_SITE_OTHER): Admitting: Orthopaedic Surgery

## 2023-08-30 ENCOUNTER — Encounter: Payer: Self-pay | Admitting: Orthopaedic Surgery

## 2023-08-30 DIAGNOSIS — Z96641 Presence of right artificial hip joint: Secondary | ICD-10-CM

## 2023-08-30 NOTE — Progress Notes (Signed)
 The patient is now 6 weeks status post a right total hip arthroplasty.  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  She has been doing well.  She is not 100% yet she states which is reasonable given her young age as well.  When she first gets up she does have a slight limp but she is able to walk this off in terms of some of the pain.  There is still some swelling around her right hip the incision looks pretty good.  She can try any type of vitamin E or aloe creams or any other scar creams at this point.  She can swim and the only restriction is no running.  She is making great progress.  The next time we need to see her is not for 6 months unless there are issues.  Will have a standing AP pelvis and lateral right hip at that visit.

## 2023-12-07 ENCOUNTER — Other Ambulatory Visit (HOSPITAL_BASED_OUTPATIENT_CLINIC_OR_DEPARTMENT_OTHER): Payer: Self-pay

## 2023-12-07 MED ORDER — FLUZONE 0.5 ML IM SUSY
0.5000 mL | PREFILLED_SYRINGE | Freq: Once | INTRAMUSCULAR | 0 refills | Status: AC
  Filled 2023-12-07: qty 0.5, 1d supply, fill #0

## 2023-12-13 ENCOUNTER — Encounter: Payer: Self-pay | Admitting: Radiology

## 2024-01-28 ENCOUNTER — Encounter: Payer: Self-pay | Admitting: Obstetrics and Gynecology

## 2024-01-28 ENCOUNTER — Ambulatory Visit: Admitting: Obstetrics and Gynecology

## 2024-01-28 ENCOUNTER — Other Ambulatory Visit (HOSPITAL_COMMUNITY)
Admission: RE | Admit: 2024-01-28 | Discharge: 2024-01-28 | Disposition: A | Discharge status: Home / Self Care | Source: Ambulatory Visit | Attending: Obstetrics and Gynecology | Admitting: Obstetrics and Gynecology

## 2024-01-28 VITALS — BP 116/70 | HR 83 | Ht 70.0 in | Wt 194.0 lb

## 2024-01-28 DIAGNOSIS — N871 Moderate cervical dysplasia: Secondary | ICD-10-CM

## 2024-01-28 DIAGNOSIS — Z01419 Encounter for gynecological examination (general) (routine) without abnormal findings: Secondary | ICD-10-CM | POA: Diagnosis present

## 2024-01-28 DIAGNOSIS — Z1331 Encounter for screening for depression: Secondary | ICD-10-CM

## 2024-01-28 MED ORDER — ESTRADIOL 0.01 % VA CREA: 1.0000 | TOPICAL_CREAM | Freq: Every day | VAGINAL | 12 refills | Status: AC
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
## 2024-02-01 ENCOUNTER — Ambulatory Visit: Payer: Self-pay | Admitting: Obstetrics and Gynecology

## 2024-02-01 LAB — CYTOLOGY - PAP: Diagnosis: NEGATIVE

## 2024-03-02 ENCOUNTER — Ambulatory Visit: Admitting: Orthopaedic Surgery

## 2024-03-02 ENCOUNTER — Other Ambulatory Visit: Payer: Self-pay

## 2024-03-02 ENCOUNTER — Encounter: Payer: Self-pay | Admitting: Orthopaedic Surgery

## 2024-03-02 DIAGNOSIS — Z96641 Presence of right artificial hip joint: Secondary | ICD-10-CM

## 2024-03-02 NOTE — Progress Notes (Signed)
 The patient is a 59 year old female who is now 7 months status post a right direct anterior total hip arthroplasty.  She is a very athletic individual and had femoral acetabular impingement.  She says the hip is doing really well.  She did work with a comptroller and got good results with the right hip.  She has had occasional pain with her left hip which has some known femoral history of impingement but not as severe as the right side.  She had had 1 intra-articular injection on the right hip but that was not helpful because her hip arthritis was significant enough.  Dr. Burnetta did place that injection.  On exam her right and left hips are moving smoothly and fully with no blocks rotation.  Her leg lengths are equal.  Standing AP pelvis and lateral right hip shows a well-seated right total hip arthroplasty.  There is slight narrowing on her left hip.  At this point follow-up can be as needed.  She is skiing this weekend and I am fine with her doing so.  She is an athletic individual.  She knows if she develops any issues with the right hip or left hip to contact us  and let us  know.  She knows that she can call the office for an appoint with Dr. Burnetta if she decides to have an ultrasound-guided steroid injection in her left hip.
